# Patient Record
Sex: Male | Born: 1985 | Race: White | Hispanic: No | Marital: Single | Smoking: Current every day smoker
Health system: Southern US, Community
[De-identification: ages and names within clinical notes are randomized; demographics above are authoritative.]

## PROBLEM LIST (undated history)

## (undated) ENCOUNTER — Emergency Department (HOSPITAL_COMMUNITY): Admission: EM | Payer: BLUE CROSS/BLUE SHIELD | Source: Home / Self Care

## (undated) ENCOUNTER — Emergency Department: Admission: EM | Payer: BLUE CROSS/BLUE SHIELD

## (undated) DIAGNOSIS — T7840XA Allergy, unspecified, initial encounter: Secondary | ICD-10-CM

## (undated) DIAGNOSIS — M549 Dorsalgia, unspecified: Secondary | ICD-10-CM

## (undated) DIAGNOSIS — G8929 Other chronic pain: Secondary | ICD-10-CM

## (undated) DIAGNOSIS — Z8719 Personal history of other diseases of the digestive system: Secondary | ICD-10-CM

## (undated) DIAGNOSIS — Z720 Tobacco use: Secondary | ICD-10-CM

## (undated) DIAGNOSIS — J449 Chronic obstructive pulmonary disease, unspecified: Secondary | ICD-10-CM

## (undated) DIAGNOSIS — Z9889 Other specified postprocedural states: Secondary | ICD-10-CM

## (undated) HISTORY — PX: APPENDECTOMY: SHX54

## (undated) HISTORY — DX: Personal history of other diseases of the digestive system: Z87.19

## (undated) HISTORY — DX: Allergy, unspecified, initial encounter: T78.40XA

## (undated) HISTORY — PX: HERNIA REPAIR: SHX51

## (undated) HISTORY — DX: Other chronic pain: G89.29

## (undated) HISTORY — DX: Tobacco use: Z72.0

## (undated) HISTORY — DX: Dorsalgia, unspecified: M54.9

## (undated) HISTORY — DX: Other specified postprocedural states: Z98.890

---
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
## 2010-01-15 ENCOUNTER — Emergency Department: Payer: Self-pay | Admitting: Internal Medicine

## 2013-02-18 ENCOUNTER — Other Ambulatory Visit (HOSPITAL_COMMUNITY): Payer: Self-pay | Admitting: Family Medicine

## 2013-02-18 DIAGNOSIS — M545 Low back pain: Secondary | ICD-10-CM

## 2013-02-23 ENCOUNTER — Other Ambulatory Visit (HOSPITAL_COMMUNITY): Payer: Self-pay

## 2013-02-24 ENCOUNTER — Ambulatory Visit (HOSPITAL_COMMUNITY)
Admission: RE | Admit: 2013-02-24 | Discharge: 2013-02-24 | Disposition: A | Discharge status: Home / Self Care | Payer: Private Health Insurance - Indemnity | Source: Ambulatory Visit | Attending: Family Medicine | Admitting: Family Medicine

## 2013-02-24 ENCOUNTER — Other Ambulatory Visit (HOSPITAL_COMMUNITY): Payer: Self-pay | Admitting: Family Medicine

## 2013-02-24 DIAGNOSIS — M545 Low back pain, unspecified: Secondary | ICD-10-CM | POA: Insufficient documentation

## 2014-01-20 ENCOUNTER — Ambulatory Visit: Payer: Self-pay | Admitting: Pain Medicine

## 2014-01-20 LAB — BASIC METABOLIC PANEL
Anion Gap: 10 (ref 7–16)
BUN: 10 mg/dL (ref 7–18)
Calcium, Total: 9.3 mg/dL (ref 8.5–10.1)
Chloride: 108 mmol/L — ABNORMAL HIGH (ref 98–107)
Co2: 25 mmol/L (ref 21–32)
Creatinine: 0.79 mg/dL (ref 0.60–1.30)
EGFR (Non-African Amer.): >60
GLUCOSE: 94 mg/dL (ref 65–99)
Osmolality: 284 (ref 275–301)
POTASSIUM: 4.1 mmol/L (ref 3.5–5.1)
Sodium: 143 mmol/L (ref 136–145)

## 2014-01-20 LAB — HEPATIC FUNCTION PANEL A (ARMC)
ALBUMIN: 4.5 g/dL (ref 3.4–5.0)
ALT: 32 U/L
Alkaline Phosphatase: 76 U/L
Bilirubin, Direct: 0.1 mg/dL (ref 0.00–0.20)
Bilirubin,Total: 0.5 mg/dL (ref 0.2–1.0)
SGOT(AST): 20 U/L (ref 15–37)
Total Protein: 8 g/dL (ref 6.4–8.2)

## 2014-01-20 LAB — MAGNESIUM: Magnesium: 1.9 mg/dL

## 2014-01-20 LAB — SEDIMENTATION RATE: ERYTHROCYTE SED RATE: 1 mm/h (ref 0–15)

## 2014-01-25 ENCOUNTER — Ambulatory Visit: Payer: Self-pay | Admitting: Pain Medicine

## 2014-02-24 ENCOUNTER — Ambulatory Visit: Payer: Self-pay | Admitting: Pain Medicine

## 2014-06-04 ENCOUNTER — Ambulatory Visit: Payer: Self-pay | Admitting: Pain Medicine

## 2015-03-14 ENCOUNTER — Ambulatory Visit: Payer: BLUE CROSS/BLUE SHIELD | Attending: Pain Medicine | Admitting: Pain Medicine

## 2015-03-14 ENCOUNTER — Encounter: Payer: Self-pay | Admitting: Pain Medicine

## 2015-03-14 VITALS — BP 143/76 | HR 79 | Temp 98.0°F | Resp 16 | Ht 72.0 in | Wt 200.0 lb

## 2015-03-14 DIAGNOSIS — E559 Vitamin D deficiency, unspecified: Secondary | ICD-10-CM | POA: Insufficient documentation

## 2015-03-14 DIAGNOSIS — G8929 Other chronic pain: Secondary | ICD-10-CM | POA: Diagnosis not present

## 2015-03-14 DIAGNOSIS — M549 Dorsalgia, unspecified: Secondary | ICD-10-CM | POA: Diagnosis present

## 2015-03-14 DIAGNOSIS — F1190 Opioid use, unspecified, uncomplicated: Secondary | ICD-10-CM | POA: Insufficient documentation

## 2015-03-14 DIAGNOSIS — M545 Low back pain, unspecified: Secondary | ICD-10-CM

## 2015-03-14 DIAGNOSIS — M47896 Other spondylosis, lumbar region: Secondary | ICD-10-CM | POA: Diagnosis not present

## 2015-03-14 DIAGNOSIS — Z79891 Long term (current) use of opiate analgesic: Secondary | ICD-10-CM | POA: Insufficient documentation

## 2015-03-14 DIAGNOSIS — M5382 Other specified dorsopathies, cervical region: Secondary | ICD-10-CM

## 2015-03-14 DIAGNOSIS — Z5181 Encounter for therapeutic drug level monitoring: Secondary | ICD-10-CM | POA: Insufficient documentation

## 2015-03-14 DIAGNOSIS — M47812 Spondylosis without myelopathy or radiculopathy, cervical region: Secondary | ICD-10-CM | POA: Insufficient documentation

## 2015-03-14 DIAGNOSIS — F1120 Opioid dependence, uncomplicated: Secondary | ICD-10-CM | POA: Insufficient documentation

## 2015-03-14 DIAGNOSIS — M47816 Spondylosis without myelopathy or radiculopathy, lumbar region: Secondary | ICD-10-CM | POA: Insufficient documentation

## 2015-03-14 MED ORDER — OXYCODONE HCL 5 MG PO TABS: 5.0000 mg | ORAL_TABLET | Freq: Four times a day (QID) | ORAL | Status: DC | PRN

## 2015-03-14 MED ORDER — OXYCODONE HCL 10 MG PO TABS: 10.0000 mg | ORAL_TABLET | Freq: Four times a day (QID) | ORAL | Status: DC | PRN

## 2015-03-14 NOTE — Progress Notes (Signed)
Patient's Name: Ricardo Knox MRN: [id number] DOB: 06-18-1985 DOS: 03/14/2015  Primary Reason(s) for Visit: Encounter for Medication Management. CC: Back Pain   HPI:   Ricardo Knox is a 29 y.o. year old, male patient, who returns today as an established patient. He has Lumbar spondylosis; Encounter for therapeutic drug level monitoring; Long term current use of opiate analgesic; Uncomplicated opioid dependence (HCC); Opiate use; Facet syndrome, lumbar; Chronic low back pain; and Chronic pain on his problem list.. His primarily concern today is the Back Pain   The patient comes into clinic today indicating that he would like to start tapering his medication down. He does not want to continue taking this Medication for the rest of his life. He indicated that the left lumbar facet block that we did months ago, did provide him with excellent relief the pain, which he still enjoys. To help him taper the medication down we will divide his dose from a 15 milligram pill to one 10 mg pill plus a 5 mg pill. We will then slowly discontinue the 5 mg pills until he is down to the 10 mg pills. We should be able to accomplish this in approximately one month. Once again we get to this point, we will do the same pain with the 10 mg pills splitting them in 5's.  Pharmacotherapy Review: Side-effects or Adverse reactions: None reported. Effectiveness: Described as relatively effective, allowing for increase in activities of daily living (ADL). Onset of action: Within expected pharmacological parameters. Duration of action: Within normal limits for medication. Peak effect: Timing and results are as within normal expected parameters. [place] PMP: Compliant with practice rules and regulations. DST: Compliant with practice rules and regulations. Lab work: No new labs ordered by our practice. Treatment compliance: Compliant. Substance Use Disorder (SUD) Risk Level: Low Planned course of action: Continue therapy as  is.  Allergies: Ricardo Knox is allergic to hydrocodone.  Meds: The patient has a current medication list which includes the following prescription(s): cyclobenzaprine, metaxalone, oxycodone, oxycodone, and oxycodone hcl. Requested Prescriptions   Signed Prescriptions Disp Refills  . Oxycodone HCl 10 MG TABS 120 tablet 0    Sig: Take 1 tablet (10 mg total) by mouth every 6 (six) hours as needed.  Marland Kitchen oxyCODONE (OXY IR/ROXICODONE) 5 MG immediate release tablet 70 tablet 0    Sig: Take 1 tablet (5 mg total) by mouth every 6 (six) hours as needed for severe pain (Take this medication 4 times a day on the first week, 3 times a day on the second week, twice a day on the third week, daily on the fourth week, and stop on the fifth week.).    ROS: Constitutional: Afebrile, no chills, well hydrated and well nourished Gastrointestinal: negative Musculoskeletal:negative Neurological: negative Behavioral/Psych: negative  PFSH: Medical:  Ricardo Knox  has a past medical history of Allergy; Tobacco use; History of herniorrhaphy; and Chronic back pain greater than 3 months duration. Family: family history includes High Cholesterol in his father; Kidney cancer in his father. Surgical:  has past surgical history that includes Appendectomy and Hernia repair. Tobacco:  reports that he has been smoking Cigarettes.  He has a 15 pack-year smoking history. He does not have any smokeless tobacco history on file. Alcohol:  reports that he does not drink alcohol. Drug:  reports that he does not use illicit drugs.  Physical Exam: Vitals:  Today's Vitals   03/14/15 0918 03/14/15 0920  BP: 143/76   Pulse: 79   Temp: 98 F (  36.7 C)   TempSrc: Oral   Resp: 16   Height: 6' (1.829 m)   Weight: 200 lb (90.719 kg)   SpO2: 99%   PainSc:  4   Calculated BMI: Body mass index is 27.12 kg/(m^2). General appearance: alert, cooperative, appears stated age and no distress Eyes: conjunctivae/corneas clear. PERRL,  EOM's intact. Fundi benign. Lungs: No evidence respiratory distress, no audible rales or ronchi and no use of accessory muscles of respiration Neck: no adenopathy, no carotid bruit, no JVD, supple, symmetrical, trachea midline and thyroid not enlarged, symmetric, no tenderness/mass/nodules Back: symmetric, no curvature. ROM normal. No CVA tenderness. Extremities: extremities normal, atraumatic, no cyanosis or edema Pulses: 2+ and symmetric Skin: Skin color, texture, turgor normal. No rashes or lesions Neurologic: Grossly normal    Assessment: Encounter Diagnosis:  Primary Diagnosis: Chronic pain [G89.29]  Plan: Ricardo Knox was seen today for back pain.  Diagnoses and all orders for this visit:  Chronic pain  Chronic low back pain  Facet syndrome, lumbar Comments: Left-sided Orders: -     LUMBAR FACET(MEDIAL BRANCH NERVE BLOCK) MBNB; Future  Opiate use  Uncomplicated opioid dependence (HCC)  Long term current use of opiate analgesic -     Drugs of abuse screen w/o alc, rtn urine-sln; Standing  Encounter for therapeutic drug level monitoring  Lumbar spondylosis  Other orders -     Oxycodone HCl 10 MG TABS; Take 1 tablet (10 mg total) by mouth every 6 (six) hours as needed. -     oxyCODONE (OXY IR/ROXICODONE) 5 MG immediate release tablet; Take 1 tablet (5 mg total) by mouth every 6 (six) hours as needed for severe pain (Take this medication 4 times a day on the first week, 3 times a day on the second week, twice a day on the third week, daily on the fourth week, and stop on the fifth week.).     There are no Patient Instructions on file for this visit. Medications discontinued today:  There are no discontinued medications. Medications administered today:  Ricardo Knox does not currently have medications on file.  Primary Care Physician: Evelene CroonNIEMEYER, MEINDERT, MD [facility] Note by: Sydnee LevansFrancisco A. Laban EmperorNaveira, M.D, DABA, DABAPM, DABPM,  DABIPP, FIPP

## 2015-03-14 NOTE — Progress Notes (Signed)
Safety precautions to be maintained throughout the outpatient stay will include: orient to surroundings, keep bed in low position, maintain call bell within reach at all times, provide assistance with transfer out of bed and ambulation.  

## 2015-03-14 NOTE — Progress Notes (Deleted)
   Subjective:    Patient ID: Ricardo Knox, male    DOB: 01-14-1986, 29 y.o.   MRN: [id number]  HPI    Review of Systems     Objective:   Physical Exam        Assessment & Plan:

## 2015-04-04 ENCOUNTER — Other Ambulatory Visit: Payer: Self-pay | Admitting: Pain Medicine

## 2015-04-13 ENCOUNTER — Encounter: Payer: Self-pay | Admitting: Pain Medicine

## 2015-04-13 ENCOUNTER — Ambulatory Visit: Payer: BLUE CROSS/BLUE SHIELD | Attending: Pain Medicine | Admitting: Pain Medicine

## 2015-04-13 VITALS — BP 141/86 | HR 70 | Temp 98.0°F | Resp 16 | Ht 72.0 in | Wt 200.0 lb

## 2015-04-13 DIAGNOSIS — F17210 Nicotine dependence, cigarettes, uncomplicated: Secondary | ICD-10-CM | POA: Insufficient documentation

## 2015-04-13 DIAGNOSIS — E559 Vitamin D deficiency, unspecified: Secondary | ICD-10-CM | POA: Insufficient documentation

## 2015-04-13 DIAGNOSIS — M47812 Spondylosis without myelopathy or radiculopathy, cervical region: Secondary | ICD-10-CM | POA: Insufficient documentation

## 2015-04-13 DIAGNOSIS — M47816 Spondylosis without myelopathy or radiculopathy, lumbar region: Secondary | ICD-10-CM | POA: Diagnosis not present

## 2015-04-13 DIAGNOSIS — G8929 Other chronic pain: Secondary | ICD-10-CM | POA: Insufficient documentation

## 2015-04-13 DIAGNOSIS — F1190 Opioid use, unspecified, uncomplicated: Secondary | ICD-10-CM | POA: Insufficient documentation

## 2015-04-13 DIAGNOSIS — M79605 Pain in left leg: Secondary | ICD-10-CM | POA: Insufficient documentation

## 2015-04-13 DIAGNOSIS — M25562 Pain in left knee: Secondary | ICD-10-CM | POA: Diagnosis not present

## 2015-04-13 DIAGNOSIS — Z79891 Long term (current) use of opiate analgesic: Secondary | ICD-10-CM

## 2015-04-13 DIAGNOSIS — M792 Neuralgia and neuritis, unspecified: Secondary | ICD-10-CM | POA: Insufficient documentation

## 2015-04-13 DIAGNOSIS — M791 Myalgia: Secondary | ICD-10-CM

## 2015-04-13 DIAGNOSIS — Z5181 Encounter for therapeutic drug level monitoring: Secondary | ICD-10-CM

## 2015-04-13 DIAGNOSIS — M5382 Other specified dorsopathies, cervical region: Secondary | ICD-10-CM | POA: Diagnosis not present

## 2015-04-13 DIAGNOSIS — M7918 Myalgia, other site: Secondary | ICD-10-CM

## 2015-04-13 DIAGNOSIS — M549 Dorsalgia, unspecified: Secondary | ICD-10-CM | POA: Diagnosis present

## 2015-04-13 MED ORDER — GABAPENTIN 300 MG PO CAPS: ORAL_CAPSULE | ORAL | Status: DC

## 2015-04-13 MED ORDER — OXYCODONE HCL 5 MG PO TABS: 5.0000 mg | ORAL_TABLET | Freq: Four times a day (QID) | ORAL | Status: DC | PRN

## 2015-04-13 MED ORDER — OXYCODONE HCL 10 MG PO TABS: 10.0000 mg | ORAL_TABLET | Freq: Four times a day (QID) | ORAL | Status: DC | PRN

## 2015-04-13 NOTE — Progress Notes (Signed)
Patient's Name: Ricardo Knox MRN: [id number] DOB: 03/12/1986 DOS: 04/13/2015  Primary Reason(s) for Visit: Encounter for Medication Management. CC: Back Pain   HPI:   Ricardo Knox is a 29 y.o. year old, male patient, who returns today as an established patient. He has Lumbar spondylosis; Encounter for therapeutic drug level monitoring; Long term current use of opiate analgesic; Uncomplicated opioid dependence ([place]); Opiate use; Lumbar facet syndrome; Chronic low back pain; Chronic pain; Cervical facet syndrome; Vitamin D deficiency; Cervical spondylosis; Neuropathic pain; Neurogenic pain; Chronic left lower extremity pain; Chronic left knee pain; and Musculoskeletal pain on his problem list.. His primarily concern today is the Back Pain     The patient comes into the clinic's today for follow-up evaluation on his medications. He seems to be doing well. No changes.  Today's Pain Score: 6 . Clinically he looks like a 2-3/10. Reported level of pain is incompatible with clinical obrservations. This may be secondary to a possible lack of understanding on how the pain scale works. Pain Type: Intractable pain Pain Location: Back Pain Orientation: Lower Pain Descriptors / Indicators: Aching, Sharp, Throbbing Pain Frequency: Constant  Date of Last Visit: 03/14/15 Service Provided on Last Visit: Med Refill  Pharmacotherapy Review:   Side-effects or Adverse reactions: None reported. Effectiveness: Described as relatively effective, allowing for increase in activities of daily living (ADL). Onset of action: Within expected pharmacological parameters. Duration of action: Within normal limits for medication. Peak effect: Timing and results are as within normal expected parameters. [place] PMP: Compliant with practice rules and regulations. UDS: Compliant with practice rules and regulations. Treatment compliance: Compliant. Substance Use Disorder (SUD) Risk Level: Low Planned course of action:  Continue therapy as is.  Last Available Lab Work: [facility] Conversion on 01/20/2014  Component Date Value Ref Range Status  . Magnesium 01/20/2014 1.9   Final  . Glucose 01/20/2014 94  65-99 mg/dL Final  . BUN 01/20/2014 10  7-18 mg/dL Final  . Creatinine 01/20/2014 0.79  0.60-1.30 mg/dL Final  . Sodium 01/20/2014 143  136-145 mmol/L Final  . Potassium 01/20/2014 4.1  3.5-5.1 mmol/L Final  . Chloride 01/20/2014 108* 98-107 mmol/L Final  . Co2 01/20/2014 25  21-32 mmol/L Final  . Calcium, Total 01/20/2014 9.3  8.5-10.1 mg/dL Final  . Osmolality 01/20/2014 284  275-301 Final  . Anion Gap 01/20/2014 10  7-16 Final  . EGFR (African American) 01/20/2014 >60   Final  . EGFR (Non-African Amer.) 01/20/2014 >60   Final  . SGOT(AST) 01/20/2014 20  15-37 Unit/L Final  . SGPT (ALT) 01/20/2014 32   Final  . Alkaline Phosphatase 01/20/2014 76   Final  . Albumin 01/20/2014 4.5  3.4-5.0 g/dL Final  . Total Protein 01/20/2014 8.0  6.4-8.2 g/dL Final  . Bilirubin,Total 01/20/2014 0.5  0.2-1.0 mg/dL Final  . Bilirubin, Direct 01/20/2014 0.1  0.00-0.20 mg/dL Final  . Erythrocyte Sed Rate 01/20/2014 1  0-15 mm/hr Final   Allergies: Ricardo Knox is allergic to hydrocodone.  Meds: The patient has a current medication list which includes the following prescription(s): cyclobenzaprine, metaxalone, oxycodone, oxycodone hcl, and gabapentin. Requested Prescriptions   Signed Prescriptions Disp Refills  . oxyCODONE (OXY IR/ROXICODONE) 5 MG immediate release tablet 120 tablet 0    Sig: Take 1 tablet (5 mg total) by mouth every 6 (six) hours as needed for severe pain (Take this medication 4 times a day on the first week, 3 times a day on the second week, twice a day on the third week,  daily on the fourth week, and stop on the fifth week.).  Marland Kitchen Oxycodone HCl 10 MG TABS 120 tablet 0    Sig: Take 1 tablet (10 mg total) by mouth every 6 (six) hours as needed.  . gabapentin (NEURONTIN) 300 MG capsule 90 capsule 0     Sig: Take 1 tablet at bedtime for 1 week, 2 tablets at bedtime on the following week, and then 3 tablets at bedtime on week 3.    ROS: Constitutional: Afebrile, no chills, well hydrated and well nourished Gastrointestinal: negative Musculoskeletal:negative Neurological: negative Behavioral/Psych: negative  PFSH: Medical:  Ricardo Knox  has a past medical history of Allergy; Tobacco use; History of herniorrhaphy; and Chronic back pain greater than 3 months duration. Family: family history includes High Cholesterol in his father; Kidney cancer in his father. Surgical:  has past surgical history that includes Appendectomy and Hernia repair. Tobacco:  reports that he has been smoking Cigarettes.  He has a 15 pack-year smoking history. He does not have any smokeless tobacco history on file. Alcohol:  reports that he does not drink alcohol. Drug:  reports that he does not use illicit drugs.  Physical Exam: Vitals:  Today's Vitals   04/13/15 0947 04/13/15 0949  BP: 141/86   Pulse: 70   Temp: 98 F (36.7 C)   Resp: 16   Height: 6' (1.829 m)   Weight: 200 lb (90.719 kg)   SpO2: 100%   PainSc: 6  6   PainLoc: Back   Calculated BMI: Body mass index is 27.12 kg/(m^2). General appearance: alert, cooperative, appears stated age and no distress Eyes: conjunctivae/corneas clear. PERRL, EOM's intact. Fundi benign. Lungs: No evidence respiratory distress, no audible rales or ronchi and no use of accessory muscles of respiration Neck: no adenopathy, no carotid bruit, no JVD, supple, symmetrical, trachea midline and thyroid not enlarged, symmetric, no tenderness/mass/nodules Back: symmetric, no curvature. ROM normal. No CVA tenderness. Extremities: extremities normal, atraumatic, no cyanosis or edema Pulses: 2+ and symmetric Skin: Skin color, texture, turgor normal. No rashes or lesions Neurologic: Grossly normal    Assessment: Encounter Diagnosis:  Primary Diagnosis: Cervical spondylosis  without myelopathy [M47.812]  Plan: Ricardo Knox was seen today for back pain.  Diagnoses and all orders for this visit:  Cervical spondylosis  Cervical facet syndrome  Encounter for therapeutic drug level monitoring  Long term current use of opiate analgesic -     Drugs of abuse screen w/o alc, rtn urine-sln -     Drugs of abuse screen w/o alc, rtn urine-sln; Standing  Opiate use  Chronic pain -     oxyCODONE (OXY IR/ROXICODONE) 5 MG immediate release tablet; Take 1 tablet (5 mg total) by mouth every 6 (six) hours as needed for severe pain (Take this medication 4 times a day on the first week, 3 times a day on the second week, twice a day on the third week, daily on the fourth week, and stop on the fifth week.). -     Oxycodone HCl 10 MG TABS; Take 1 tablet (10 mg total) by mouth every 6 (six) hours as needed.  Neuropathic pain -     gabapentin (NEURONTIN) 300 MG capsule; Take 1 tablet at bedtime for 1 week, 2 tablets at bedtime on the following week, and then 3 tablets at bedtime on week 3.  Neurogenic pain  Lumbar spondylosis  Chronic left lower extremity pain  Chronic left knee pain  Musculoskeletal pain     Patient Instructions  Smoking  Cessation, Tips for Success If you are ready to quit smoking, congratulations! You have chosen to help yourself be healthier. Cigarettes bring nicotine, tar, carbon monoxide, and other irritants into your body. Your lungs, heart, and blood vessels will be able to work better without these poisons. There are many different ways to quit smoking. Nicotine gum, nicotine patches, a nicotine inhaler, or nicotine nasal spray can help with physical craving. Hypnosis, support groups, and medicines help break the habit of smoking. WHAT THINGS CAN I DO TO MAKE QUITTING EASIER?  Here are some tips to help you quit for good:  Pick a date when you will quit smoking completely. Tell all of your friends and family about your plan to quit on that  date.  Do not try to slowly cut down on the number of cigarettes you are smoking. Pick a quit date and quit smoking completely starting on that day.  Throw away all cigarettes.   Clean and remove all ashtrays from your home, work, and car.  On a card, write down your reasons for quitting. Carry the card with you and read it when you get the urge to smoke.  Cleanse your body of nicotine. Drink enough water and fluids to keep your urine clear or pale yellow. Do this after quitting to flush the nicotine from your body.  Learn to predict your moods. Do not let a bad situation be your excuse to have a cigarette. Some situations in your life might tempt you into wanting a cigarette.  Never have "just one" cigarette. It leads to wanting another and another. Remind yourself of your decision to quit.  Change habits associated with smoking. If you smoked while driving or when feeling stressed, try other activities to replace smoking. Stand up when drinking your coffee. Brush your teeth after eating. Sit in a different chair when you read the paper. Avoid alcohol while trying to quit, and try to drink fewer caffeinated beverages. Alcohol and caffeine may urge you to smoke.  Avoid foods and drinks that can trigger a desire to smoke, such as sugary or spicy foods and alcohol.  Ask people who smoke not to smoke around you.  Have something planned to do right after eating or having a cup of coffee. For example, plan to take a walk or exercise.  Try a relaxation exercise to calm you down and decrease your stress. Remember, you may be tense and nervous for the first 2 weeks after you quit, but this will pass.  Find new activities to keep your hands busy. Play with a pen, coin, or rubber band. Doodle or draw things on paper.  Brush your teeth right after eating. This will help cut down on the craving for the taste of tobacco after meals. You can also try mouthwash.   Use oral substitutes in place of  cigarettes. Try using lemon drops, carrots, cinnamon sticks, or chewing gum. Keep them handy so they are available when you have the urge to smoke.  When you have the urge to smoke, try deep breathing.  Designate your home as a nonsmoking area.  If you are a heavy smoker, ask your health care provider about a prescription for nicotine chewing gum. It can ease your withdrawal from nicotine.  Reward yourself. Set aside the cigarette money you save and buy yourself something nice.  Look for support from others. Join a support group or smoking cessation program. Ask someone at home or at work to help you with your plan  to quit smoking.  Always ask yourself, "Do I need this cigarette or is this just a reflex?" Tell yourself, "Today, I choose not to smoke," or "I do not want to smoke." You are reminding yourself of your decision to quit.  Do not replace cigarette smoking with electronic cigarettes (commonly called e-cigarettes). The safety of e-cigarettes is unknown, and some may contain harmful chemicals.  If you relapse, do not give up! Plan ahead and think about what you will do the next time you get the urge to smoke. HOW WILL I FEEL WHEN I QUIT SMOKING? You may have symptoms of withdrawal because your body is used to nicotine (the addictive substance in cigarettes). You may crave cigarettes, be irritable, feel very hungry, cough often, get headaches, or have difficulty concentrating. The withdrawal symptoms are only temporary. They are strongest when you first quit but will go away within 10-14 days. When withdrawal symptoms occur, stay in control. Think about your reasons for quitting. Remind yourself that these are signs that your body is healing and getting used to being without cigarettes. Remember that withdrawal symptoms are easier to treat than the major diseases that smoking can cause.  Even after the withdrawal is over, expect periodic urges to smoke. However, these cravings are generally  short lived and will go away whether you smoke or not. Do not smoke! WHAT RESOURCES ARE AVAILABLE TO HELP ME QUIT SMOKING? Your health care provider can direct you to community resources or hospitals for support, which may include:  Group support.  Education.  Hypnosis.  Therapy.   This information is not intended to replace advice given to you by your health care provider. Make sure you discuss any questions you have with your health care provider.   Document Released: 02/10/2004 Document Revised: 06/04/2014 Document Reviewed: 10/30/2012 Elsevier Interactive Patient Education 2016 Elsevier Inc. Gabapentin capsules or tablets What is this medicine? GABAPENTIN (GA ba pen tin) is used to control partial seizures in adults with epilepsy. It is also used to treat certain types of nerve pain. This medicine may be used for other purposes; ask your health care provider or pharmacist if you have questions. What should I tell my health care provider before I take this medicine? They need to know if you have any of these conditions: -kidney disease -suicidal thoughts, plans, or attempt; a previous suicide attempt by you or a family member -an unusual or allergic reaction to gabapentin, other medicines, foods, dyes, or preservatives -pregnant or trying to get pregnant -breast-feeding How should I use this medicine? Take this medicine by mouth with a glass of water. Follow the directions on the prescription label. You can take it with or without food. If it upsets your stomach, take it with food.Take your medicine at regular intervals. Do not take it more often than directed. Do not stop taking except on your doctor's advice. If you are directed to break the 600 or 800 mg tablets in half as part of your dose, the extra half tablet should be used for the next dose. If you have not used the extra half tablet within 28 days, it should be thrown away. A special MedGuide will be given to you by the  pharmacist with each prescription and refill. Be sure to read this information carefully each time. Talk to your pediatrician regarding the use of this medicine in children. Special care may be needed. Overdosage: If you think you have taken too much of this medicine contact a poison control  center or emergency room at once. NOTE: This medicine is only for you. Do not share this medicine with others. What if I miss a dose? If you miss a dose, take it as soon as you can. If it is almost time for your next dose, take only that dose. Do not take double or extra doses. What may interact with this medicine? Do not take this medicine with any of the following medications: -other gabapentin products This medicine may also interact with the following medications: -alcohol -antacids -antihistamines for allergy, cough and cold -certain medicines for anxiety or sleep -certain medicines for depression or psychotic disturbances -homatropine; hydrocodone -naproxen -narcotic medicines (opiates) for pain -phenothiazines like chlorpromazine, mesoridazine, prochlorperazine, thioridazine This list may not describe all possible interactions. Give your health care provider a list of all the medicines, herbs, non-prescription drugs, or dietary supplements you use. Also tell them if you smoke, drink alcohol, or use illegal drugs. Some items may interact with your medicine. What should I watch for while using this medicine? Visit your doctor or health care professional for regular checks on your progress. You may want to keep a record at home of how you feel your condition is responding to treatment. You may want to share this information with your doctor or health care professional at each visit. You should contact your doctor or health care professional if your seizures get worse or if you have any new types of seizures. Do not stop taking this medicine or any of your seizure medicines unless instructed by your  doctor or health care professional. Stopping your medicine suddenly can increase your seizures or their severity. Wear a medical identification bracelet or chain if you are taking this medicine for seizures, and carry a card that lists all your medications. You may get drowsy, dizzy, or have blurred vision. Do not drive, use machinery, or do anything that needs mental alertness until you know how this medicine affects you. To reduce dizzy or fainting spells, do not sit or stand up quickly, especially if you are an older patient. Alcohol can increase drowsiness and dizziness. Avoid alcoholic drinks. Your mouth may get dry. Chewing sugarless gum or sucking hard candy, and drinking plenty of water will help. The use of this medicine may increase the chance of suicidal thoughts or actions. Pay special attention to how you are responding while on this medicine. Any worsening of mood, or thoughts of suicide or dying should be reported to your health care professional right away. Women who become pregnant while using this medicine may enroll in the [place] Pregnancy Registry by calling [phone]. This registry collects information about the safety of antiepileptic drug use during pregnancy. What side effects may I notice from receiving this medicine? Side effects that you should report to your doctor or health care professional as soon as possible: -allergic reactions like skin rash, itching or hives, swelling of the face, lips, or tongue -worsening of mood, thoughts or actions of suicide or dying Side effects that usually do not require medical attention (report to your doctor or health care professional if they continue or are bothersome): -constipation -difficulty walking or controlling muscle movements -dizziness -nausea -slurred speech -tiredness -tremors -weight gain This list may not describe all possible side effects. Call your doctor for medical advice about side  effects. You may report side effects to FDA at 1-800-FDA-1088. Where should I keep my medicine? Keep out of reach of children. This medicine may cause accidental overdose and  death if it taken by other adults, children, or pets. Mix any unused medicine with a substance like cat litter or coffee grounds. Then throw the medicine away in a sealed container like a sealed bag or a coffee can with a lid. Do not use the medicine after the expiration date. Store at room temperature between 15 and 30 degrees C (59 and 86 degrees F). NOTE: This sheet is a summary. It may not cover all possible information. If you have questions about this medicine, talk to your doctor, pharmacist, or health care provider.    2016, Elsevier/Gold Standard. (2013-07-10 15:26:50)    Medications discontinued today:  Medications Discontinued During This Encounter  Medication Reason  . oxyCODONE (ROXICODONE) 15 MG immediate release tablet Error  . oxyCODONE (OXY IR/ROXICODONE) 5 MG immediate release tablet Reorder  . Oxycodone HCl 10 MG TABS Reorder   Medications administered today:  Ricardo Knox had no medications administered during this visit.  Primary Care Physician: Lorelee Market, MD Location: [facility] Note by: Kathlen Brunswick Dossie Arbour, M.D, DABA, DABAPM, DABPM, DABIPP, FIPP

## 2015-04-13 NOTE — Patient Instructions (Addendum)
Smoking Cessation, Tips for Success If you are ready to quit smoking, congratulations! You have chosen to help yourself be healthier. Cigarettes bring nicotine, tar, carbon monoxide, and other irritants into your body. Your lungs, heart, and blood vessels will be able to work better without these poisons. There are many different ways to quit smoking. Nicotine gum, nicotine patches, a nicotine inhaler, or nicotine nasal spray can help with physical craving. Hypnosis, support groups, and medicines help break the habit of smoking. WHAT THINGS CAN I DO TO MAKE QUITTING EASIER?  Here are some tips to help you quit for good:  Pick a date when you will quit smoking completely. Tell all of your friends and family about your plan to quit on that date.  Do not try to slowly cut down on the number of cigarettes you are smoking. Pick a quit date and quit smoking completely starting on that day.  Throw away all cigarettes.   Clean and remove all ashtrays from your home, work, and car.  On a card, write down your reasons for quitting. Carry the card with you and read it when you get the urge to smoke.  Cleanse your body of nicotine. Drink enough water and fluids to keep your urine clear or pale yellow. Do this after quitting to flush the nicotine from your body.  Learn to predict your moods. Do not let a bad situation be your excuse to have a cigarette. Some situations in your life might tempt you into wanting a cigarette.  Never have "just one" cigarette. It leads to wanting another and another. Remind yourself of your decision to quit.  Change habits associated with smoking. If you smoked while driving or when feeling stressed, try other activities to replace smoking. Stand up when drinking your coffee. Brush your teeth after eating. Sit in a different chair when you read the paper. Avoid alcohol while trying to quit, and try to drink fewer caffeinated beverages. Alcohol and caffeine may urge you to  smoke.  Avoid foods and drinks that can trigger a desire to smoke, such as sugary or spicy foods and alcohol.  Ask people who smoke not to smoke around you.  Have something planned to do right after eating or having a cup of coffee. For example, plan to take a walk or exercise.  Try a relaxation exercise to calm you down and decrease your stress. Remember, you may be tense and nervous for the first 2 weeks after you quit, but this will pass.  Find new activities to keep your hands busy. Play with a pen, coin, or rubber band. Doodle or draw things on paper.  Brush your teeth right after eating. This will help cut down on the craving for the taste of tobacco after meals. You can also try mouthwash.   Use oral substitutes in place of cigarettes. Try using lemon drops, carrots, cinnamon sticks, or chewing gum. Keep them handy so they are available when you have the urge to smoke.  When you have the urge to smoke, try deep breathing.  Designate your home as a nonsmoking area.  If you are a heavy smoker, ask your health care provider about a prescription for nicotine chewing gum. It can ease your withdrawal from nicotine.  Reward yourself. Set aside the cigarette money you save and buy yourself something nice.  Look for support from others. Join a support group or smoking cessation program. Ask someone at home or at work to help you with your plan   to quit smoking.  Always ask yourself, "Do I need this cigarette or is this just a reflex?" Tell yourself, "Today, I choose not to smoke," or "I do not want to smoke." You are reminding yourself of your decision to quit.  Do not replace cigarette smoking with electronic cigarettes (commonly called e-cigarettes). The safety of e-cigarettes is unknown, and some may contain harmful chemicals.  If you relapse, do not give up! Plan ahead and think about what you will do the next time you get the urge to smoke. HOW WILL I FEEL WHEN I QUIT SMOKING? You  may have symptoms of withdrawal because your body is used to nicotine (the addictive substance in cigarettes). You may crave cigarettes, be irritable, feel very hungry, cough often, get headaches, or have difficulty concentrating. The withdrawal symptoms are only temporary. They are strongest when you first quit but will go away within 10-14 days. When withdrawal symptoms occur, stay in control. Think about your reasons for quitting. Remind yourself that these are signs that your body is healing and getting used to being without cigarettes. Remember that withdrawal symptoms are easier to treat than the major diseases that smoking can cause.  Even after the withdrawal is over, expect periodic urges to smoke. However, these cravings are generally short lived and will go away whether you smoke or not. Do not smoke! WHAT RESOURCES ARE AVAILABLE TO HELP ME QUIT SMOKING? Your health care provider can direct you to community resources or hospitals for support, which may include:  Group support.  Education.  Hypnosis.  Therapy.   This information is not intended to replace advice given to you by your health care provider. Make sure you discuss any questions you have with your health care provider.   Document Released: 02/10/2004 Document Revised: 06/04/2014 Document Reviewed: 10/30/2012 Elsevier Interactive Patient Education 2016 Elsevier [employer] Gabapentin capsules or tablets What is this medicine? GABAPENTIN (GA ba pen tin) is used to control partial seizures in adults with epilepsy. It is also used to treat certain types of nerve pain. This medicine may be used for other purposes; ask your health care provider or pharmacist if you have questions. What should I tell my health care provider before I take this medicine? They need to know if you have any of these conditions: -kidney disease -suicidal thoughts, plans, or attempt; a previous suicide attempt by you or a family member -an unusual or  allergic reaction to gabapentin, other medicines, foods, dyes, or preservatives -pregnant or trying to get pregnant -breast-feeding How should I use this medicine? Take this medicine by mouth with a glass of water. Follow the directions on the prescription label. You can take it with or without food. If it upsets your stomach, take it with food.Take your medicine at regular intervals. Do not take it more often than directed. Do not stop taking except on your doctor's advice. If you are directed to break the 600 or 800 mg tablets in half as part of your dose, the extra half tablet should be used for the next dose. If you have not used the extra half tablet within 28 days, it should be thrown away. A special MedGuide will be given to you by the pharmacist with each prescription and refill. Be sure to read this information carefully each time. Talk to your pediatrician regarding the use of this medicine in children. Special care may be needed. Overdosage: If you think you have taken too much of this medicine contact a poison control  center or emergency room at once. NOTE: This medicine is only for you. Do not share this medicine with others. What if I miss a dose? If you miss a dose, take it as soon as you can. If it is almost time for your next dose, take only that dose. Do not take double or extra doses. What may interact with this medicine? Do not take this medicine with any of the following medications: -other gabapentin products This medicine may also interact with the following medications: -alcohol -antacids -antihistamines for allergy, cough and cold -certain medicines for anxiety or sleep -certain medicines for depression or psychotic disturbances -homatropine; hydrocodone -naproxen -narcotic medicines (opiates) for pain -phenothiazines like chlorpromazine, mesoridazine, prochlorperazine, thioridazine This list may not describe all possible interactions. Give your health care provider  a list of all the medicines, herbs, non-prescription drugs, or dietary supplements you use. Also tell them if you smoke, drink alcohol, or use illegal drugs. Some items may interact with your medicine. What should I watch for while using this medicine? Visit your doctor or health care professional for regular checks on your progress. You may want to keep a record at home of how you feel your condition is responding to treatment. You may want to share this information with your doctor or health care professional at each visit. You should contact your doctor or health care professional if your seizures get worse or if you have any new types of seizures. Do not stop taking this medicine or any of your seizure medicines unless instructed by your doctor or health care professional. Stopping your medicine suddenly can increase your seizures or their severity. Wear a medical identification bracelet or chain if you are taking this medicine for seizures, and carry a card that lists all your medications. You may get drowsy, dizzy, or have blurred vision. Do not drive, use machinery, or do anything that needs mental alertness until you know how this medicine affects you. To reduce dizzy or fainting spells, do not sit or stand up quickly, especially if you are an older patient. Alcohol can increase drowsiness and dizziness. Avoid alcoholic drinks. Your mouth may get dry. Chewing sugarless gum or sucking hard candy, and drinking plenty of water will help. The use of this medicine may increase the chance of suicidal thoughts or actions. Pay special attention to how you are responding while on this medicine. Any worsening of mood, or thoughts of suicide or dying should be reported to your health care professional right away. Women who become pregnant while using this medicine may enroll in the [place] American Antiepileptic Drug Pregnancy Registry by calling [phone]. This registry collects information about the safety  of antiepileptic drug use during pregnancy. What side effects may I notice from receiving this medicine? Side effects that you should report to your doctor or health care professional as soon as possible: -allergic reactions like skin rash, itching or hives, swelling of the face, lips, or tongue -worsening of mood, thoughts or actions of suicide or dying Side effects that usually do not require medical attention (report to your doctor or health care professional if they continue or are bothersome): -constipation -difficulty walking or controlling muscle movements -dizziness -nausea -slurred speech -tiredness -tremors -weight gain This list may not describe all possible side effects. Call your doctor for medical advice about side effects. You may report side effects to FDA at 1-800-FDA-1088. Where should I keep my medicine? Keep out of reach of children. This medicine may cause accidental overdose and  death if it taken by other adults, children, or pets. Mix any unused medicine with a substance like cat litter or coffee grounds. Then throw the medicine away in a sealed container like a sealed bag or a coffee can with a lid. Do not use the medicine after the expiration date. Store at room temperature between 15 and 30 degrees C (59 and 86 degrees F). NOTE: This sheet is a summary. It may not cover all possible information. If you have questions about this medicine, talk to your doctor, pharmacist, or health care provider.    2016, Elsevier/Gold Standard. (2013-07-10 15:26:50)

## 2015-04-13 NOTE — Progress Notes (Signed)
Safety precautions to be maintained throughout the outpatient stay will include: orient to surroundings, keep bed in low position, maintain call bell within reach at all times, provide assistance with transfer out of bed and ambulation. Pill count #0 

## 2015-05-12 ENCOUNTER — Encounter: Payer: Self-pay | Admitting: Pain Medicine

## 2015-05-12 ENCOUNTER — Ambulatory Visit: Payer: BLUE CROSS/BLUE SHIELD | Attending: Pain Medicine | Admitting: Pain Medicine

## 2015-05-12 VITALS — BP 114/86 | HR 76 | Temp 98.7°F | Resp 16 | Ht 72.0 in | Wt 200.0 lb

## 2015-05-12 DIAGNOSIS — G8929 Other chronic pain: Secondary | ICD-10-CM | POA: Insufficient documentation

## 2015-05-12 DIAGNOSIS — Z79899 Other long term (current) drug therapy: Secondary | ICD-10-CM

## 2015-05-12 DIAGNOSIS — Z79891 Long term (current) use of opiate analgesic: Secondary | ICD-10-CM

## 2015-05-12 DIAGNOSIS — M47816 Spondylosis without myelopathy or radiculopathy, lumbar region: Secondary | ICD-10-CM

## 2015-05-12 DIAGNOSIS — M79605 Pain in left leg: Secondary | ICD-10-CM | POA: Diagnosis not present

## 2015-05-12 DIAGNOSIS — F1120 Opioid dependence, uncomplicated: Secondary | ICD-10-CM

## 2015-05-12 DIAGNOSIS — F1190 Opioid use, unspecified, uncomplicated: Secondary | ICD-10-CM | POA: Diagnosis not present

## 2015-05-12 DIAGNOSIS — M545 Low back pain, unspecified: Secondary | ICD-10-CM

## 2015-05-12 DIAGNOSIS — Z7189 Other specified counseling: Secondary | ICD-10-CM

## 2015-05-12 DIAGNOSIS — M792 Neuralgia and neuritis, unspecified: Secondary | ICD-10-CM

## 2015-05-12 DIAGNOSIS — F17200 Nicotine dependence, unspecified, uncomplicated: Secondary | ICD-10-CM | POA: Insufficient documentation

## 2015-05-12 DIAGNOSIS — M549 Dorsalgia, unspecified: Secondary | ICD-10-CM | POA: Insufficient documentation

## 2015-05-12 DIAGNOSIS — Z5181 Encounter for therapeutic drug level monitoring: Secondary | ICD-10-CM

## 2015-05-12 MED ORDER — CYCLOBENZAPRINE HCL 10 MG PO TABS: 10.0000 mg | ORAL_TABLET | Freq: Every day | ORAL | Status: DC

## 2015-05-12 MED ORDER — GABAPENTIN 300 MG PO CAPS: ORAL_CAPSULE | ORAL | Status: DC

## 2015-05-12 MED ORDER — OXYCODONE HCL 10 MG PO TABS: 10.0000 mg | ORAL_TABLET | Freq: Four times a day (QID) | ORAL | Status: DC | PRN

## 2015-05-12 NOTE — Progress Notes (Signed)
Patient's Name: Ricardo Knox MRN: [id number] DOB: Mar 27, 1986 DOS: 05/12/2015  Primary Reason(s) for Visit: Encounter for Medication Management CC: Back Pain   HPI:    Ricardo Knox is a 29 y.o. year old, male patient, who returns today as an established patient. He has Lumbar spondylosis (L3-4 DDD by 2014 MRI); Encounter for therapeutic drug level monitoring; Long term current use of opiate analgesic; Uncomplicated opioid dependence (HCC); Opiate use; Lumbar facet syndrome (Left); Chronic low back pain (Location of Primary Source of Pain) (Left); Chronic pain; Cervical facet syndrome; Vitamin D deficiency; Cervical spondylosis; Neuropathic pain; Neurogenic pain; Chronic lower extremity pain (Left); Chronic knee pain (Left); Musculoskeletal pain; Long term prescription opiate use; Encounter for chronic pain management; and Opioid dependence, daily use (HCC) on his problem list.. His primarily concern today is the Back Pain     Patient returns to the clinic today for pharmacological management of his chronic pain. He apparently is short on his medications since he refers having taken the last one this morning but according to the PMP and our records, he should've had enough for 30 days, including today. He has requested an early refill, which was denied.  Today's Pain Score: 4  (last pain medicine this morning), clinically he looks like a 1/10. Reported level of pain is incompatible with clinical obrservations. This may be secondary to a possible lack of understanding on how the pain scale works.    Date of Last Visit: 04/13/15 Service Provided on Last Visit: Med Refill  Pharmacotherapy Review:   Side-effects or Adverse reactions: None reported Effectiveness: Described as relatively effective, allowing for increase in activities of daily living (ADL) Onset of action: Within expected pharmacological parameters Duration of action: Within normal limits for medication Peak effect: Timing and  results are as within normal expected parameters [place] PMP: Compliant with practice rules and regulations UDS Results: 04/13/2015 UDS was within normal limits. Results were as expected UDS Interpretation: Patient appears to be compliant with practice rules and regulations Medication Assessment Form: Reviewed. Patient indicates being compliant with therapy Treatment compliance: Compliant. Apparently he must have taken more than prescribed since he took his last pill this morning and he should've had enough for the entire day. Substance Use Disorder (SUD) Risk Level: Moderate Pharmacologic Plan: Continue therapy as is. Early refilled denied.  Lab Work: Inflammation Markers Lab Results  Component Value Date   ESRSEDRATE 1 01/20/2014    Renal Function Lab Results  Component Value Date   BUN 10 01/20/2014   CREATININE 0.79 01/20/2014   GFRAA >60 01/20/2014   GFRNONAA >60 01/20/2014    Hepatic Function Lab Results  Component Value Date   AST 20 01/20/2014   ALT 32 01/20/2014   ALBUMIN 4.5 01/20/2014    Electrolytes Lab Results  Component Value Date   NA 143 01/20/2014   K 4.1 01/20/2014   CL 108* 01/20/2014   CALCIUM 9.3 01/20/2014    Allergies:  Mr. Briles is allergic to hydrocodone.  Meds:  The patient has a current medication list which includes the following prescription(s): gabapentin, naproxen sodium, oxycodone hcl, cyclobenzaprine, and oxycodone hcl. Requested Prescriptions   Signed Prescriptions Disp Refills  . gabapentin (NEURONTIN) 300 MG capsule 90 capsule 1    Sig: Take 1 tablet at bedtime for 1 week, 2 tablets at bedtime on the following week, and then 3 tablets at bedtime on week 3.  . cyclobenzaprine (FLEXERIL) 10 MG tablet 30 tablet 1    Sig: Take 1 tablet (10  mg total) by mouth at bedtime. Reported on 05/12/2015  . Oxycodone HCl 10 MG TABS 120 tablet 0    Sig: Take 1 tablet (10 mg total) by mouth every 6 (six) hours as needed.  . Oxycodone HCl 10 MG  TABS 120 tablet 0    Sig: Take 1 tablet (10 mg total) by mouth every 6 (six) hours as needed.    ROS:  Constitutional: Afebrile, no chills, well hydrated and well nourished Gastrointestinal: negative Musculoskeletal:negative Neurological: negative Behavioral/Psych: negative  PFSH:  Medical:  Ricardo Knox  has a past medical history of Allergy; Tobacco use; History of herniorrhaphy; and Chronic back pain greater than 3 months duration. Family: family history includes High Cholesterol in his father; Kidney cancer in his father. Surgical:  has past surgical history that includes Appendectomy and Hernia repair. Tobacco:  reports that he has been smoking Cigarettes.  He has a 10 pack-year smoking history. He does not have any smokeless tobacco history on file. Alcohol:  reports that he does not drink alcohol. Drug:  reports that he does not use illicit drugs.  Physical Exam:  Vitals:  Today's Vitals   05/12/15 1048  BP: 114/86  Pulse: 76  Temp: 98.7 F (37.1 C)  TempSrc: Oral  Resp: 16  Height: 6' (1.829 m)  Weight: 200 lb (90.719 kg)  SpO2: 100%  PainSc: 4   Calculated BMI: Body mass index is 27.12 kg/(m^2). General appearance: alert, cooperative, appears stated age and no distress Eyes: PERLA Respiratory: No evidence respiratory distress, no audible rales or ronchi and no use of accessory muscles of respiration Neck: no adenopathy, no carotid bruit, no JVD, supple, symmetrical, trachea midline and thyroid not enlarged, symmetric, no tenderness/mass/nodules  Cervical Spine ROM: Adequate for flexion, extension, rotation, and lateral bending Palpation: No palpable trigger points  Upper Extremities ROM: Adequate bilaterally Strength: 5/5 for all flexors and extensors of the upper extremity, bilaterally Pulses: Palpable bilaterally Neurologic: No allodynia, No hyperesthesia, No hyperpathia and No sensory abnormalities  Lumbar Spine ROM: Adequate for flexion, extension,  rotation, and lateral bending Palpation: No palpable trigger points Lumbar Hyperextension and rotation: Non-contributory Patrick's Maneuver: Non-contributory  Lower Extremities ROM: Adequate bilaterally Strength: 5/5 for all flexors and extensors of the lower extremity, bilaterally Pulses: Palpable bilaterally Neurologic: No allodynia, No hyperesthesia, No hyperpathia, No sensory abnormalities and No antalgic gait or posture  Assessment:  Encounter Diagnosis:  Primary Diagnosis: Chronic pain [G89.29]  Plan:  Interventional Therapies: PRN procedure: Left lumbar epidural steroid injection under fluoroscopic guidance and IV sedation.  Arkin was seen today for back pain.  Diagnoses and all orders for this visit:  Chronic pain -     cyclobenzaprine (FLEXERIL) 10 MG tablet; Take 1 tablet (10 mg total) by mouth at bedtime. Reported on 05/12/2015 -     Oxycodone HCl 10 MG TABS; Take 1 tablet (10 mg total) by mouth every 6 (six) hours as needed. -     Oxycodone HCl 10 MG TABS; Take 1 tablet (10 mg total) by mouth every 6 (six) hours as needed.  Chronic low back pain  Chronic left lower extremity pain  Uncomplicated opioid dependence (HCC)  Opiate use  Long term current use of opiate analgesic  Encounter for therapeutic drug level monitoring  Neuropathic pain -     gabapentin (NEURONTIN) 300 MG capsule; Take 1 tablet at bedtime for 1 week, 2 tablets at bedtime on the following week, and then 3 tablets at bedtime on week 3.  Lumbar spondylosis (  L3-4 DDD by 2014 MRI)  Lumbar facet syndrome (Left)  Long term prescription opiate use  Encounter for chronic pain management  Opioid dependence, daily use [facility](HCC)     Patient Instructions  You were given 2 prescriptions for Oxycodone today.  Medications discontinued today:  Medications Discontinued During This Encounter  Medication Reason  . oxyCODONE (OXY IR/ROXICODONE) 5 MG immediate release tablet Change in therapy  .  metaxalone (SKELAXIN) 800 MG tablet   . gabapentin (NEURONTIN) 300 MG capsule Reorder  . cyclobenzaprine (FLEXERIL) 10 MG tablet Reorder  . Oxycodone HCl 10 MG TABS Reorder   Medications administered today:  [name] had no medications administered during this visit.  Primary Care Physician: Evelene CroonNIEMEYER, MEINDERT, MD [facility] Note by: Sydnee LevansFrancisco A. Laban EmperorNaveira, M.D, DABA, DABAPM, DABPM, DABIPP, FIPP

## 2015-05-12 NOTE — Patient Instructions (Signed)
You were given 2 prescriptions for Oxycodone today. 

## 2015-05-12 NOTE — Progress Notes (Signed)
Patient is here today for medication refill.  Did not receive Rx for flexeril or skelaxin last time would like to have one of these prescribed for HS.  Does not have any pain medication left in his bottle.

## 2015-07-11 ENCOUNTER — Encounter: Payer: Self-pay | Admitting: Pain Medicine

## 2015-07-11 ENCOUNTER — Ambulatory Visit: Payer: 59 | Attending: Pain Medicine | Admitting: Pain Medicine

## 2015-07-11 ENCOUNTER — Other Ambulatory Visit: Payer: Self-pay | Admitting: Pain Medicine

## 2015-07-11 VITALS — BP 148/78 | HR 88 | Temp 98.4°F | Resp 14 | Ht 72.0 in | Wt 200.0 lb

## 2015-07-11 DIAGNOSIS — Z5181 Encounter for therapeutic drug level monitoring: Secondary | ICD-10-CM | POA: Diagnosis not present

## 2015-07-11 DIAGNOSIS — M79605 Pain in left leg: Secondary | ICD-10-CM | POA: Diagnosis not present

## 2015-07-11 DIAGNOSIS — M791 Myalgia: Secondary | ICD-10-CM | POA: Diagnosis not present

## 2015-07-11 DIAGNOSIS — M47816 Spondylosis without myelopathy or radiculopathy, lumbar region: Secondary | ICD-10-CM | POA: Diagnosis not present

## 2015-07-11 DIAGNOSIS — F17210 Nicotine dependence, cigarettes, uncomplicated: Secondary | ICD-10-CM | POA: Diagnosis not present

## 2015-07-11 DIAGNOSIS — G8929 Other chronic pain: Secondary | ICD-10-CM | POA: Diagnosis not present

## 2015-07-11 DIAGNOSIS — F1190 Opioid use, unspecified, uncomplicated: Secondary | ICD-10-CM | POA: Insufficient documentation

## 2015-07-11 DIAGNOSIS — E559 Vitamin D deficiency, unspecified: Secondary | ICD-10-CM | POA: Diagnosis not present

## 2015-07-11 DIAGNOSIS — M545 Low back pain, unspecified: Secondary | ICD-10-CM

## 2015-07-11 DIAGNOSIS — M792 Neuralgia and neuritis, unspecified: Secondary | ICD-10-CM

## 2015-07-11 DIAGNOSIS — Z79891 Long term (current) use of opiate analgesic: Secondary | ICD-10-CM | POA: Diagnosis not present

## 2015-07-11 DIAGNOSIS — M549 Dorsalgia, unspecified: Secondary | ICD-10-CM | POA: Diagnosis present

## 2015-07-11 LAB — COMPREHENSIVE METABOLIC PANEL
ALBUMIN: 4.6 g/dL (ref 3.5–5.0)
ALK PHOS: 95 U/L (ref 38–126)
ALT: 48 U/L (ref 17–63)
AST: 28 U/L (ref 15–41)
Anion gap: 6 (ref 5–15)
BILIRUBIN TOTAL: 0.7 mg/dL (ref 0.3–1.2)
BUN: 11 mg/dL (ref 6–20)
CALCIUM: 9.4 mg/dL (ref 8.9–10.3)
CO2: 26 mmol/L (ref 22–32)
CREATININE: 0.76 mg/dL (ref 0.61–1.24)
Chloride: 106 mmol/L (ref 101–111)
GFR calc Af Amer: >60 mL/min (ref >60)
GFR calc non Af Amer: >60 mL/min (ref >60)
GLUCOSE: 96 mg/dL (ref 65–99)
Potassium: 4.6 mmol/L (ref 3.5–5.1)
SODIUM: 138 mmol/L (ref 135–145)
Total Protein: 7.7 g/dL (ref 6.5–8.1)

## 2015-07-11 LAB — SEDIMENTATION RATE: Sed Rate: 6 mm/hr (ref 0–15)

## 2015-07-11 LAB — C-REACTIVE PROTEIN: CRP: 1.1 mg/dL — ABNORMAL HIGH (ref <1.0)

## 2015-07-11 LAB — MAGNESIUM: Magnesium: 1.9 mg/dL (ref 1.7–2.4)

## 2015-07-11 MED ORDER — OXYCODONE HCL 10 MG PO TABS: 10.0000 mg | ORAL_TABLET | Freq: Four times a day (QID) | ORAL | Status: DC | PRN

## 2015-07-11 MED ORDER — GABAPENTIN 300 MG PO CAPS: ORAL_CAPSULE | ORAL | Status: DC

## 2015-07-11 MED ORDER — CYCLOBENZAPRINE HCL 10 MG PO TABS: 10.0000 mg | ORAL_TABLET | Freq: Every day | ORAL | Status: DC

## 2015-07-11 NOTE — Progress Notes (Signed)
Quick Note:  Lab results reviewed and found to be within normal limits. ______ 

## 2015-07-11 NOTE — Progress Notes (Signed)
Pill count Oxycodone - #2 

## 2015-07-11 NOTE — Progress Notes (Signed)

## 2015-07-11 NOTE — Progress Notes (Signed)
Patient's Name: Ricardo Knox MRN: [id number] DOB: [birth year] DOS: 07/11/2015  Primary Reason(s) for Visit: Encounter for Medication Management CC: Back Pain   HPI  Ricardo Knox is a 30 y.o. year old, male patient, who returns today as an established patient. He has Lumbar spondylosis (L3-4 DDD by 2014 MRI); Encounter for therapeutic drug level monitoring; Long term current use of opiate analgesic; Uncomplicated opioid dependence (HCC); Opiate use; Lumbar facet syndrome (Location of Primary Source of Pain) (Bilateral) (L>R); Chronic low back pain (Location of Primary Source of Pain) (Left); Chronic pain; Cervical facet syndrome; Vitamin D deficiency; Cervical spondylosis; Neuropathic pain; Neurogenic pain; Chronic lower extremity pain (Location of Secondary source of pain) (Left); Chronic knee pain (Left); Musculoskeletal pain; Long term prescription opiate use; Encounter for chronic pain management; and Opioid dependence, daily use (HCC) on his problem list.. His primarily concern today is the Back Pain   The patient returns to the clinics today for pharmacological management of his chronic pain. His primary pain is in the lower back with the left being worst on the right. The secondary pain is to left lower extremity. This leg pain is intermittent and it goes down to the knee through the lateral and anterior portion of the thigh. In the past patient has had some lumbar facet blocks with good results. Today we talked to him about the possibility of radiofrequency ablation.  Reported Pain Score: 5  Reported level is inconsistent with clinical obrservations. Pain Type: Chronic pain Pain Location: Back Pain Orientation: Left, Lower Pain Descriptors / Indicators: Aching, Sharp Pain Frequency: Intermittent  Date of Last Visit: 04/13/15 Service Provided on Last Visit: Med Refill  Pharmacotherapy  Medication(s): Oxycodone 10 1 tablet by mouth every 6 hours when necessary for pain Onset of  action: Within expected pharmacological parameters. (30 minutes) Time to Peak effect: Timing and results are as within normal expected parameters. (45 minutes to one hour) Analgesic Effect: 75% Activity Facilitation: Medication(s) allow patient to sit, stand, walk, and do the basic ADLs Perceived Effectiveness: Described as relatively effective, allowing for increase in activities of daily living (ADL) Side-effects or Adverse reactions: None reported Duration of action: Within normal limits for medication. (4 hours) [place] PMP: Compliant with practice rules and regulations UDS Results: Last UDS done on 03/14/2015 and it came back within normal limits with no unexpected results. UDS Interpretation: Patient appears to be compliant with practice rules and regulations Medication Assessment Form: Reviewed. Patient indicates being compliant with therapy Treatment compliance: Compliant Substance Use Disorder (SUD) Risk Level: Low Pharmacologic Plan: Continue therapy as is  Lab Work: Illicit Drugs No results found for: THCU, COCAINSCRNUR, PCPSCRNUR, MDMA, AMPHETMU, METHADONE, ETOH  Inflammation Markers Lab Results  Component Value Date   ESRSEDRATE 6 07/11/2015    Renal Function Lab Results  Component Value Date   BUN 11 07/11/2015   CREATININE 0.76 07/11/2015   GFRAA >60 07/11/2015   GFRNONAA >60 07/11/2015    Hepatic Function Lab Results  Component Value Date   AST 28 07/11/2015   ALT 48 07/11/2015   ALBUMIN 4.6 07/11/2015    Electrolytes Lab Results  Component Value Date   NA 138 07/11/2015   K 4.6 07/11/2015   CL 106 07/11/2015   CALCIUM 9.4 07/11/2015   MG 1.9 07/11/2015    Allergies  [name] is allergic to hydrocodone.  Meds  The patient has a current medication list which includes the following prescription(s): cyclobenzaprine, gabapentin, naproxen sodium, oxycodone hcl, and oxycodone hcl.  Current  Outpatient Prescriptions on File Prior to Visit  Medication  Sig  . naproxen sodium (ANAPROX) 220 MG tablet Take 220 mg by mouth as needed.    No current facility-administered medications on file prior to visit.    ROS  Constitutional: Afebrile, no chills, well hydrated and well nourished Gastrointestinal: negative Musculoskeletal:negative Neurological: negative Behavioral/Psych: negative  PFSH  Medical:  [name]  has a past medical history of Allergy; Tobacco use; History of herniorrhaphy; and Chronic back pain greater than 3 months duration. Family: family history includes High Cholesterol in his father; Kidney cancer in his father. Surgical:  has past surgical history that includes Appendectomy and Hernia repair. Tobacco:  reports that he has been smoking Cigarettes.  He has a 10 pack-year smoking history. He does not have any smokeless tobacco history on file. Alcohol:  reports that he does not drink alcohol. Drug:  reports that he does not use illicit drugs.  Physical Exam  Vitals:  Today's Vitals   07/11/15 0929  BP: 148/78  Pulse: 88  Temp: 98.4 F (36.9 C)  TempSrc: Oral  Resp: 14  Height: 6' (1.829 m)  Weight: 200 lb (90.719 kg)  SpO2: 100%  PainSc: 5   PainLoc: Back    Calculated BMI: Body mass index is 27.12 kg/(m^2).  General appearance: alert, cooperative, appears stated age and no distress Eyes: PERLA Respiratory: No evidence respiratory distress, no audible rales or ronchi and no use of accessory muscles of respiration  Cervical Spine Inspection: Normal anatomy Alignment: Symetrical ROM: Adequate  Upper Extremities Inspection: No gross anomalies detected ROM: Adequate Sensory: Normal Motor: Unremarkable  Thoracic Spine Inspection: No gross anomalies detected Alignment: Symetrical ROM: Adequate Palpation: WNL  Lumbar Spine Inspection: No gross anomalies detected Alignment: Symetrical ROM: Decreased Palpation: Tender Provocative Tests:  Lumbar Hyperextension and rotation test:  Positive  bilaterally Patrick's Maneuver: deferred Gait: WNL  Lower Extremities Inspection: No gross anomalies detected ROM: Adequate Sensory:  Normal Motor: Unremarkable  Toe walk (S1): WNL  Heal walk (L5): WNL  Assessment & Plan  Primary Diagnosis & Pertinent Problem List: The primary encounter diagnosis was Chronic pain. Diagnoses of Encounter for therapeutic drug level monitoring, Long term current use of opiate analgesic, Chronic low back pain (Location of Primary Source of Pain) (Left), Neuropathic pain, Lumbar facet syndrome (Left), and Chronic lower extremity pain (Location of Secondary source of pain) (Left) were also pertinent to this visit.  Visit Diagnosis: 1. Chronic pain   2. Encounter for therapeutic drug level monitoring   3. Long term current use of opiate analgesic   4. Chronic low back pain (Location of Primary Source of Pain) (Left)   5. Neuropathic pain   6. Lumbar facet syndrome (Left)   7. Chronic lower extremity pain (Location of Secondary source of pain) (Left)     Assessment: No problem-specific assessment & plan notes found for this encounter.   Plan of Care  Pharmacotherapy (Medications Ordered): Meds ordered this encounter  Medications  . cyclobenzaprine (FLEXERIL) 10 MG tablet    Sig: Take 1 tablet (10 mg total) by mouth at bedtime. Reported on 05/12/2015    Dispense:  30 tablet    Refill:  1    Do not place this medication, or any other prescription from our practice, on "Automatic Refill". Patient may have prescription filled one day early if pharmacy is closed on scheduled refill date.  . Oxycodone HCl 10 MG TABS    Sig: Take 1 tablet (10 mg total) by mouth  every 6 (six) hours as needed.    Dispense:  120 tablet    Refill:  0    Do not place this medication, or any other prescription from our practice, on "Automatic Refill". Patient may have prescription filled one day early if pharmacy is closed on scheduled refill date. Do not fill until:  07/12/15 To last until: 08/11/15  . Oxycodone HCl 10 MG TABS    Sig: Take 1 tablet (10 mg total) by mouth every 6 (six) hours as needed.    Dispense:  120 tablet    Refill:  0    Do not place this medication, or any other prescription from our practice, on "Automatic Refill". Patient may have prescription filled one day early if pharmacy is closed on scheduled refill date. Do not fill until: 08/11/15 To last until: 09/10/15  . gabapentin (NEURONTIN) 300 MG capsule    Sig: Take 1 tablet at bedtime for 1 week, 2 tablets at bedtime on the following week, and then 3 tablets at bedtime on week 3.    Dispense:  90 capsule    Refill:  1    Do not place this medication, or any other prescription from our practice, on "Automatic Refill". Patient may have prescription filled one day early if pharmacy is closed on scheduled refill date.    Lab-work & Procedure Ordered: Orders Placed This Encounter  Procedures  . LUMBAR EPIDURAL STEROID INJECTION    Standing Status: Standing     Number of Occurrences: 1     Standing Expiration Date: 07/10/2016    Scheduling Instructions:     Side: Left-sided     Sedation: With Sedation.     Timeframe: PRN Procedure. Patient will call to schedule.    Order Specific Question:  Where will this procedure be performed?    Answer:  [facility]  . LUMBAR FACET(MEDIAL BRANCH NERVE BLOCK) MBNB    Standing Status: Standing     Number of Occurrences: 1     Standing Expiration Date: 07/10/2016    Scheduling Instructions:     Side: Bilateral     Level: L2, L3, L4, L5, & S1 Medial Branch Nerve     Sedation: With Sedation.     Timeframe: PRN Procedure. Patient will call to schedule.    Order Specific Question:  Where will this procedure be performed?    Answer:  [facility]  . Drugs of abuse screen w/o alc, rtn urine-sln    Volume: 10 ml(s). Minimum 3 ml of urine is needed. Document temperature of fresh sample. Indications: Long term (current) use of  opiate analgesic (Z79.891) Test#: [id number] ([employer]ox[employer] Select-13)  . Comprehensive metabolic panel    Order Specific Question:  Has the patient fasted?    Answer:  No  . C-reactive protein  . Magnesium  . Sedimentation rate  . Vitamin B12    Indication: Bone Pain (M89.9)  . Vitamin D pnl(25-hydrxy+1,25-dihy)-bld    Imaging Ordered: None  Interventional Therapies: Scheduled: None at this time. PRN Procedures:  1. For the low back pain, bilateral lumbar facet block under fluoroscopic guidance and IV sedation.  2. For the left lower extremity pain, left lumbar epidural steroid injection under fluoroscopic guidance and IV sedation.  3. In addition, we are considering lumbar facet radiofrequency ablation.    Referral(s) or Consult(s): None at this point.  Medications administered during this visit: Mr. Caponi had no medications administered during this visit.  Future Appointments Date Time Provider Department  Center  09/07/2015 11:20 AM Delano Metz, MD [facility] None    Primary Care Physician: Evelene Croon, MD Location: [facility] Facility Note by: Sydnee Levans Laban Emperor, M.D, DABA, DABAPM, DABPM, DABIPP, FIPP

## 2015-07-11 NOTE — Patient Instructions (Signed)
Epidural Steroid Injection Patient Information  Description: The epidural space surrounds the nerves as they exit the spinal cord.  In some patients, the nerves can be compressed and inflamed by a bulging disc or a tight spinal canal (spinal stenosis).  By injecting steroids into the epidural space, we can bring irritated nerves into direct contact with a potentially helpful medication.  These steroids act directly on the irritated nerves and can reduce swelling and inflammation which often leads to decreased pain.  Epidural steroids may be injected anywhere along the spine and from the neck to the low back depending upon the location of your pain.   After numbing the skin with local anesthetic (like Novocaine), a small needle is passed into the epidural space slowly.  You may experience a sensation of pressure while this is being done.  The entire block usually last less than 10 minutes.  Conditions which may be treated by epidural steroids:   Low back and leg pain  Neck and arm pain  Spinal stenosis  Post-laminectomy syndrome  Herpes zoster (shingles) pain  Pain from compression fractures  Preparation for the injection:  1. Do not eat any solid food or dairy products within 6 hours of your appointment.  2. You may drink clear liquids up to 2 hours before appointment.  Clear liquids include water, black coffee, juice or soda.  No milk or cream please. 3. You may take your regular medication, including pain medications, with a sip of water before your appointment  Diabetics should hold regular insulin (if taken separately) and take 1/2 normal NPH dos the morning of the procedure.  Carry some sugar containing items with you to your appointment. 4. A driver must accompany you and be prepared to drive you home after your procedure.  5. Bring all your current medications with your. 6. An IV may be inserted and sedation may be given at the discretion of the physician.   7. A blood pressure  cuff, EKG and other monitors will often be applied during the procedure.  Some patients may need to have extra oxygen administered for a short period. 8. You will be asked to provide medical information, including your allergies, prior to the procedure.  We must know immediately if you are taking blood thinners (like Coumadin/Warfarin)  Or if you are allergic to IV iodine contrast (dye). We must know if you could possible be pregnant.  Possible side-effects:  Bleeding from needle site  Infection (rare, may require surgery)  Nerve injury (rare)  Numbness & tingling (temporary)  Difficulty urinating (rare, temporary)  Spinal headache ( a headache worse with upright posture)  Light -headedness (temporary)  Pain at injection site (several days)  Decreased blood pressure (temporary)  Weakness in arm/leg (temporary)  Pressure sensation in back/neck (temporary)  Call if you experience:  Fever/chills associated with headache or increased back/neck pain.  Headache worsened by an upright position.  New onset weakness or numbness of an extremity below the injection site  Hives or difficulty breathing (go to the emergency room)  Inflammation or drainage at the infection site  Severe back/neck pain  Any new symptoms which are concerning to you  Please note:  Although the local anesthetic injected can often make your back or neck feel good for several hours after the injection, the pain will likely return.  It takes 3-7 days for steroids to work in the epidural space.  You may not notice any pain relief for at least that one week.    If effective, we will often do a series of three injections spaced 3-6 weeks apart to maximally decrease your pain.  After the initial series, we generally will wait several months before considering a repeat injection of the same type.  If you have any questions, please call [phone] [facility]  Facet  Blocks Patient Information  Description: The facets are joints in the spine between the vertebrae.  Like any joints in the body, facets can become irritated and painful.  Arthritis can also effect the facets.  By injecting steroids and local anesthetic in and around these joints, we can temporarily block the nerve supply to them.  Steroids act directly on irritated nerves and tissues to reduce selling and inflammation which often leads to decreased pain.  Facet blocks may be done anywhere along the spine from the neck to the low back depending upon the location of your pain.   After numbing the skin with local anesthetic (like Novocaine), a small needle is passed onto the facet joints under x-ray guidance.  You may experience a sensation of pressure while this is being done.  The entire block usually lasts about 15-25 minutes.   Conditions which may be treated by facet blocks:   Low back/buttock pain  Neck/shoulder pain  Certain types of headaches  Preparation for the injection:  12. Do not eat any solid food or dairy products within 6 hours of your appointment. 13. You may drink clear liquid up to 2 hours before appointment.  Clear liquids include water, black coffee, juice or soda.  No milk or cream please. 14. You may take your regular medication, including pain medications, with a sip of water before your appointment.  Diabetics should hold regular insulin (if taken separately) and take 1/2 normal NPH dose the morning of the procedure.  Carry some sugar containing items with you to your appointment. 15. A driver must accompany you and be prepared to drive you home after your procedure. 16. Bring all your current medications with you. 17. An IV may be inserted and sedation may be given at the discretion of the physician. 18. A blood pressure cuff, EKG and other monitors will often be applied during the procedure.  Some patients may need to have extra oxygen administered for a short  period. 19. You will be asked to provide medical information, including your allergies and medications, prior to the procedure.  We must know immediately if you are taking blood thinners (like Coumadin/Warfarin) or if you are allergic to IV iodine contrast (dye).  We must know if you could possible be pregnant.  Possible side-effects:   Bleeding from needle site  Infection (rare, may require surgery)  Nerve injury (rare)  Numbness & tingling (temporary)  Difficulty urinating (rare, temporary)  Spinal headache (a headache worse with upright posture)  Light-headedness (temporary)  Pain at injection site (serveral days)  Decreased blood pressure (rare, temporary)  Weakness in arm/leg (temporary)  Pressure sensation in back/neck (temporary)   Call if you experience:   Fever/chills associated with headache or increased back/neck pain  Headache worsened by an upright position  New onset, weakness or numbness of an extremity below the injection site  Hives or difficulty breathing (go to the emergency room)  Inflammation or drainage at the injection site(s)  Severe back/neck pain greater than usual  New symptoms which are concerning to you  Please note:  Although the local anesthetic injected can often make your back or neck feel  good for several hours after the injection, the pain will likely return. It takes 3-7 days for steroids to work.  You may not notice any pain relief for at least one week.  If effective, we will often do a series of 2-3 injections spaced 3-6 weeks apart to maximally decrease your pain.  After the initial series, you may be a candidate for a more permanent nerve block of the facets.  If you have any questions, please call #[phone] [facility]  A prescription for GABAPENTIN, FLEXERIL was sent to your pharmacy and should be available for pickup today.  A prescription for OXYCODONE was given to you  today.  Please get lab work done as soon as possible.

## 2015-07-16 LAB — TOXASSURE SELECT 13 (MW), URINE: PDF: 0

## 2015-07-21 ENCOUNTER — Telehealth: Payer: Self-pay | Admitting: Pain Medicine

## 2015-07-21 NOTE — Telephone Encounter (Signed)
Was able to reach pharmacy and clarify order.

## 2015-07-21 NOTE — Telephone Encounter (Signed)
Still needs clarification on gabapentin script / please call [facility] so they can fill

## 2015-07-21 NOTE — Telephone Encounter (Signed)
Attempted to call [facility] to clarify x 4.  No answer.  No choice to leave message.  Will try again later.

## 2015-07-24 ENCOUNTER — Encounter: Payer: Self-pay | Admitting: Pain Medicine

## 2015-07-24 NOTE — Progress Notes (Signed)
Quick Note:  Positive, undisclosed use of illicit substance (Cannabinoids). Our program has a "Zero Tolerance" for the use of illicit substances. As a consequence of the results obtained on this test, we will no longer offer controlled substances as a therapeutic option for this patient, until the UDS return clear. In addition, we will check to see if this is a recurrent event. Today the patient will be given a final warning and informed that should this event happen again, we will no longer continue to offer opioids as an alternative treatment option. In the event that our records reveal that this warning had previously been given to the patient, we will then immediately discontinue this mode of therapy. ______ 

## 2015-09-07 ENCOUNTER — Encounter: Payer: 59 | Admitting: Pain Medicine

## 2017-08-06 ENCOUNTER — Encounter: Payer: Self-pay | Admitting: "Endocrinology

## 2017-08-06 LAB — TSH: TSH: 0.21 — AB (ref <=5.90)

## 2017-09-16 ENCOUNTER — Ambulatory Visit (INDEPENDENT_AMBULATORY_CARE_PROVIDER_SITE_OTHER): Payer: BLUE CROSS/BLUE SHIELD | Admitting: "Endocrinology

## 2017-09-16 ENCOUNTER — Encounter: Payer: Self-pay | Admitting: "Endocrinology

## 2017-09-16 VITALS — BP 117/84 | HR 78 | Ht 71.0 in | Wt 189.0 lb

## 2017-09-16 DIAGNOSIS — E0590 Thyrotoxicosis, unspecified without thyrotoxic crisis or storm: Secondary | ICD-10-CM | POA: Diagnosis not present

## 2017-09-16 NOTE — Progress Notes (Signed)
Endocrinology Consult Note                                            09/16/2017, 5:53 PM   Subjective:    Patient ID: Ricardo Knox, male    DOB: 05-19-1986, PCP Erasmo DownerStrader, Lindsey F, NP   Past Medical History:  Diagnosis Date  . Allergy   . Chronic back pain greater than 3 months duration   . History of herniorrhaphy   . Tobacco use    Past Surgical History:  Procedure Laterality Date  . APPENDECTOMY    . HERNIA REPAIR     Social History   Socioeconomic History  . Marital status: Single    Spouse name: Not on file  . Number of children: Not on file  . Years of education: Not on file  . Highest education level: Not on file  Occupational History  . Not on file  Social Needs  . Financial resource strain: Not on file  . Food insecurity:    Worry: Not on file    Inability: Not on file  . Transportation needs:    Medical: Not on file    Non-medical: Not on file  Tobacco Use  . Smoking status: Current Every Day Smoker    Packs/day: 1.50    Years: 10.00    Pack years: 15.00    Types: Cigarettes  . Smokeless tobacco: Never Used  Substance and Sexual Activity  . Alcohol use: No    Alcohol/week: 0.0 oz  . Drug use: No  . Sexual activity: Not on file  Lifestyle  . Physical activity:    Days per week: Not on file    Minutes per session: Not on file  . Stress: Not on file  Relationships  . Social connections:    Talks on phone: Not on file    Gets together: Not on file    Attends religious service: Not on file    Active member of club or organization: Not on file    Attends meetings of clubs or organizations: Not on file    Relationship status: Not on file  Other Topics Concern  . Not on file  Social History Narrative  . Not on file   Outpatient Encounter Medications as of 09/16/2017  Medication Sig  . buPROPion (WELLBUTRIN XL) 150 MG 24 hr tablet every morning.  . venlafaxine XR (EFFEXOR-XR) 75 MG 24 hr capsule every morning.  . [DISCONTINUED]  cyclobenzaprine (FLEXERIL) 10 MG tablet Take 1 tablet (10 mg total) by mouth at bedtime. Reported on 05/12/2015  . [DISCONTINUED] gabapentin (NEURONTIN) 300 MG capsule Take 1 tablet at bedtime for 1 week, 2 tablets at bedtime on the following week, and then 3 tablets at bedtime on week 3.  . [DISCONTINUED] naproxen sodium (ANAPROX) 220 MG tablet Take 220 mg by mouth as needed.   . [DISCONTINUED] Oxycodone HCl 10 MG TABS Take 1 tablet (10 mg total) by mouth every 6 (six) hours as needed.  . [DISCONTINUED] Oxycodone HCl 10 MG TABS Take 1 tablet (10 mg total) by mouth every 6 (six) hours as needed.   No facility-administered encounter medications on file as of 09/16/2017.    ALLERGIES: Allergies  Allergen Reactions  . Hydrocodone     VACCINATION STATUS:  There is no immunization history on file for this patient.  HPI  Ricardo Knox is 32 y.o. male who presents today with a medical history as above. he is being seen in consultation for subclinical hyperthyroidism requested by Erasmo Downer, NP.  he has been dealing with symptoms of on and off anxiety, on and off palpitations, and sleep disturbance.  He has lost approximately 10 pounds over a period of 1 year. He underwent thyroid function test on August 06, 2017 showing free T4 low normal at 0.9 associated with suppressed TSH of 0.21.  He reports some unidentified thyroid dysfunction in 1 of his aunts.  He denies any exposure to thyroid hormone or antithyroid medications. He denies dysphagia, shortness of breath, change in voice.  He denies heat/cold intolerance.  Review of Systems  Constitutional: +weight loss, + fatigue, - subjective hyperthermia, no subjective hypothermia Eyes: no blurry vision, no xerophthalmia ENT: no sore throat, no nodules palpated in throat, no dysphagia/odynophagia, no hoarseness Cardiovascular: no Chest Pain, no Shortness of Breath, no palpitations, no leg swelling Respiratory: no cough, no  SOB Gastrointestinal: no Nausea/Vomiting/Diarhhea Musculoskeletal: no muscle/joint aches Skin: no rashes Neurological: no tremors, no numbness, no tingling, no dizziness Psychiatric: no depression, no anxiety  Objective:    BP 117/84   Pulse 78   Ht 5\' 11"  (1.803 m)   Wt 189 lb (85.7 kg)   BMI 26.36 kg/m   Wt Readings from Last 3 Encounters:  09/16/17 189 lb (85.7 kg)  07/11/15 200 lb (90.7 kg)  05/12/15 200 lb (90.7 kg)    Physical Exam  Constitutional: + Appropriate weight for height, + slightly anxious state of mind.  Not in acute distress. Eyes: PERRLA, EOMI, no exophthalmos ENT: moist mucous membranes, no thyromegaly, no cervical lymphadenopathy Cardiovascular: normal precordial activity, Regular Rate and Rhythm, no Murmur/Rubs/Gallops Respiratory:  adequate breathing efforts, no gross chest deformity, Clear to auscultation bilaterally Gastrointestinal: abdomen soft, Non -tender, No distension, Bowel Sounds present Musculoskeletal: no gross deformities, strength intact in all four extremities Skin: moist, warm, no rashes Neurological: no tremor with outstretched hands, Deep tendon reflexes normal in all four extremities.  CMP ( most recent) CMP     Component Value Date/Time   NA 138 07/11/2015 1058   NA 143 01/20/2014 1642   K 4.6 07/11/2015 1058   K 4.1 01/20/2014 1642   CL 106 07/11/2015 1058   CL 108 (H) 01/20/2014 1642   CO2 26 07/11/2015 1058   CO2 25 01/20/2014 1642   GLUCOSE 96 07/11/2015 1058   GLUCOSE 94 01/20/2014 1642   BUN 11 07/11/2015 1058   BUN 10 01/20/2014 1642   CREATININE 0.76 07/11/2015 1058   CREATININE 0.79 01/20/2014 1642   CALCIUM 9.4 07/11/2015 1058   CALCIUM 9.3 01/20/2014 1642   PROT 7.7 07/11/2015 1058   PROT 8.0 01/20/2014 1642   ALBUMIN 4.6 07/11/2015 1058   ALBUMIN 4.5 01/20/2014 1642   AST 28 07/11/2015 1058   AST 20 01/20/2014 1642   ALT 48 07/11/2015 1058   ALT 32 01/20/2014 1642   ALKPHOS 95 07/11/2015 1058   ALKPHOS  76 01/20/2014 1642   BILITOT 0.7 07/11/2015 1058   BILITOT 0.5 01/20/2014 1642   GFRNONAA >60 07/11/2015 1058   GFRNONAA >60 01/20/2014 1642   GFRAA >60 07/11/2015 1058   GFRAA >60 01/20/2014 1642   August 06, 2017 labs: Free T4 0.9 (normal 0.8-1.8), TSH 0.2 1 (normal 0.4-4.5)   Assessment & Plan:   1. Subclinical hyperthyroidism  - ELION HOCKER  is being seen at a kind  request of Erasmo Downer, NP. - I have reviewed his available thyroid records and clinically evaluated the patient.  - Based on  reviews, he has subclinical hyperthyroidism.  -He does have some subtle clinical signs/symptoms of thyrotoxicosis. This is not sufficient information to proceed with treatment plan.  -he will need a repeat,  more complete thyroid function tests towards confirming the diagnosis.  -Will be sent to lab today for TSH, free T4/free T3, and antithyroid antibodies.  -he will return in 1 week to review his repeat labs.   If her labs are suggestive of hyperthyroidism, she would be considered for thyroid uptake and scan to confirm diagnosis.  - I did not initiate any new prescriptions today. -He is extensively counseled against smoking. - I advised patient to maintain close follow up with Erasmo Downer, NP for primary care needs.   - Time spent with the patient: 45 minutes, of which >50% was spent in obtaining information about his symptoms, reviewing his previous labs, evaluations, and treatments, counseling him about his abnormal thyroid function tests, chronic heavy smoking, and developing a plan to confirm the diagnosis and long term treatment as necessary.  Ricardo Griffins participated in the discussions, expressed understanding, and voiced agreement with the above plans.  All questions were answered to his satisfaction. he is encouraged to contact clinic should he have any questions or concerns prior to his return visit.  Follow up plan: Return in about 1 week (around  09/23/2017) for follow up with pre-visit labs, labs today.   Marquis Lunch, MD [facility] [address] [place], [place] [address] Phone: [phone]  Fax: [phone]     09/16/2017, 5:53 PM  This note was partially dictated with voice recognition software. Similar sounding words can be transcribed inadequately or may not  be corrected upon review.

## 2017-09-17 LAB — THYROID PEROXIDASE ANTIBODY: Thyroperoxidase Ab SerPl-aCnc: <1 IU/mL (ref <9)

## 2017-09-17 LAB — THYROGLOBULIN ANTIBODY: Thyroglobulin Ab: <1 IU/mL (ref <=1)

## 2017-09-17 LAB — TSH: TSH: 0.34 mIU/L — ABNORMAL LOW (ref 0.40–4.50)

## 2017-09-17 LAB — T3, FREE: T3, Free: 3.6 pg/mL (ref 2.3–4.2)

## 2017-09-17 LAB — T4, FREE: FREE T4: 1.2 ng/dL (ref 0.8–1.8)

## 2017-09-25 ENCOUNTER — Ambulatory Visit (INDEPENDENT_AMBULATORY_CARE_PROVIDER_SITE_OTHER): Payer: BLUE CROSS/BLUE SHIELD | Admitting: "Endocrinology

## 2017-09-25 ENCOUNTER — Encounter: Payer: Self-pay | Admitting: "Endocrinology

## 2017-09-25 VITALS — BP 129/77 | HR 85 | Ht 71.0 in | Wt 185.0 lb

## 2017-09-25 DIAGNOSIS — F17200 Nicotine dependence, unspecified, uncomplicated: Secondary | ICD-10-CM | POA: Diagnosis not present

## 2017-09-25 DIAGNOSIS — E0590 Thyrotoxicosis, unspecified without thyrotoxic crisis or storm: Secondary | ICD-10-CM | POA: Diagnosis not present

## 2017-09-25 MED ORDER — METHIMAZOLE 5 MG PO TABS: 5.0000 mg | ORAL_TABLET | Freq: Every day | ORAL | 3 refills | Status: DC

## 2017-09-25 NOTE — Progress Notes (Signed)
Endocrinology follow-up note                                            09/25/2017, 6:07 PM   Subjective:    Patient ID: Ricardo Knox, male    DOB: 1985/11/26, PCP Erasmo Downer, NP   Past Medical History:  Diagnosis Date  . Allergy   . Chronic back pain greater than 3 months duration   . History of herniorrhaphy   . Tobacco use    Past Surgical History:  Procedure Laterality Date  . APPENDECTOMY    . HERNIA REPAIR     Social History   Socioeconomic History  . Marital status: Single    Spouse name: Not on file  . Number of children: Not on file  . Years of education: Not on file  . Highest education level: Not on file  Occupational History  . Not on file  Social Needs  . Financial resource strain: Not on file  . Food insecurity:    Worry: Not on file    Inability: Not on file  . Transportation needs:    Medical: Not on file    Non-medical: Not on file  Tobacco Use  . Smoking status: Current Every Day Smoker    Packs/day: 1.50    Years: 10.00    Pack years: 15.00    Types: Cigarettes  . Smokeless tobacco: Never Used  Substance and Sexual Activity  . Alcohol use: No    Alcohol/week: 0.0 oz  . Drug use: No  . Sexual activity: Not on file  Lifestyle  . Physical activity:    Days per week: Not on file    Minutes per session: Not on file  . Stress: Not on file  Relationships  . Social connections:    Talks on phone: Not on file    Gets together: Not on file    Attends religious service: Not on file    Active member of club or organization: Not on file    Attends meetings of clubs or organizations: Not on file    Relationship status: Not on file  Other Topics Concern  . Not on file  Social History Narrative  . Not on file   Outpatient Encounter Medications as of 09/25/2017  Medication Sig  . buPROPion (WELLBUTRIN XL) 150 MG 24 hr tablet every morning.  . methimazole (TAPAZOLE) 5 MG tablet Take 1 tablet (5 mg total) by mouth daily.  Marland Kitchen  venlafaxine XR (EFFEXOR-XR) 75 MG 24 hr capsule every morning.   No facility-administered encounter medications on file as of 09/25/2017.    ALLERGIES: Allergies  Allergen Reactions  . Hydrocodone     VACCINATION STATUS:  There is no immunization history on file for this patient.  HPI Ricardo Knox is 32 y.o. male who presents today with a medical history as above. he is returning with full set of thyroid function test after he was seen in consultation for subclinical hyperthyroidism.   -He has no new complaints today, however  he has been dealing with symptoms of on and off anxiety, on and off palpitations, and sleep disturbance.  He has lost approximately 15 pounds over a period of 1 year. -His repeat thyroid function tests are still consistent with subclinical hyperthyroidism. -  He reports some unidentified thyroid dysfunction in 1 of his  aunts.  He denies any exposure to thyroid hormone or antithyroid medications. He denies dysphagia, shortness of breath, change in voice.  He denies heat/cold intolerance.  He continues to smoke heavily.  Review of Systems  Constitutional: +weight loss, + fatigue, - subjective hyperthermia, no subjective hypothermia Eyes: no blurry vision, no xerophthalmia ENT: no sore throat, no nodules palpated in throat, no dysphagia/odynophagia, no hoarseness Cardiovascular: no Chest Pain, no Shortness of Breath, no palpitations, no leg swelling Respiratory: no cough, no SOB Gastrointestinal: no Nausea/Vomiting/Diarhhea Musculoskeletal: no muscle/joint aches Skin: no rashes Neurological: no tremors, no numbness, no tingling, no dizziness Psychiatric: no depression, no anxiety  Objective:    BP 129/77   Pulse 85   Ht  (1.803 m)   Wt 185 lb (83.9 kg)   BMI 25.80 kg/m   Wt Readings from Last 3 Encounters:  09/25/17 185 lb (83.9 kg)  09/16/17 189 lb (85.7 kg)  07/11/15 200 lb (90.7 kg)    Physical Exam  Constitutional: + Appropriate weight  for height, slightly anxious   Not in acute distress. Eyes: PERRLA, EOMI, no exophthalmos ENT: moist mucous membranes, no thyromegaly, no cervical lymphadenopathy Musculoskeletal: no gross deformities, strength intact in all four extremities Skin: moist, warm, no rashes Neurological: no tremor with outstretched hands  CMP ( most recent) CMP     Component Value Date/Time   NA 138 07/11/2015 1058   NA 143 01/20/2014 1642   K 4.6 07/11/2015 1058   K 4.1 01/20/2014 1642   CL 106 07/11/2015 1058   CL 108 (H) 01/20/2014 1642   CO2 26 07/11/2015 1058   CO2 25 01/20/2014 1642   GLUCOSE 96 07/11/2015 1058   GLUCOSE 94 01/20/2014 1642   BUN 11 07/11/2015 1058   BUN 10 01/20/2014 1642   CREATININE 0.76 07/11/2015 1058   CREATININE 0.79 01/20/2014 1642   CALCIUM 9.4 07/11/2015 1058   CALCIUM 9.3 01/20/2014 1642   PROT 7.7 07/11/2015 1058   PROT 8.0 01/20/2014 1642   ALBUMIN 4.6 07/11/2015 1058   ALBUMIN 4.5 01/20/2014 1642   AST 28 07/11/2015 1058   AST 20 01/20/2014 1642   ALT 48 07/11/2015 1058   ALT 32 01/20/2014 1642   ALKPHOS 95 07/11/2015 1058   ALKPHOS 76 01/20/2014 1642   BILITOT 0.7 07/11/2015 1058   BILITOT 0.5 01/20/2014 1642   GFRNONAA >60 07/11/2015 1058   GFRNONAA >60 01/20/2014 1642   GFRAA >60 07/11/2015 1058   GFRAA >60 01/20/2014 1642   Recent Results (from the past 2160 hour(s))  TSH     Status: Abnormal   Collection Time: 08/06/17 12:00 AM  Result Value Ref Range   TSH 0.21 (A) 0.41 - 5.90    Comment: Free T4 was 0.9 ( 0.8-1.8)  TSH     Status: Abnormal   Collection Time: 09/16/17  3:19 PM  Result Value Ref Range   TSH 0.34 (L) 0.40 - 4.50 mIU/L  T4, free     Status: None   Collection Time: 09/16/17  3:19 PM  Result Value Ref Range   Free T4 1.2 0.8 - 1.8 ng/dL  T3, free     Status: None   Collection Time: 09/16/17  3:19 PM  Result Value Ref Range   T3, Free 3.6 2.3 - 4.2 pg/mL  Thyroid peroxidase antibody     Status: None   Collection Time:  09/16/17  3:19 PM  Result Value Ref Range   Thyroperoxidase Ab SerPl-aCnc <1 <9 IU/mL  Thyroglobulin  antibody     Status: None   Collection Time: 09/16/17  3:19 PM  Result Value Ref Range   Thyroglobulin Ab <1 < or = 1 IU/mL    August 06, 2017 labs: Free T4 0.9 (normal 0.8-1.8), TSH 0.21 (normal 0.4-4.5)   Assessment & Plan:   1. Subclinical hyperthyroidism 2.  Chronic heavy smoking -His repeat labs are still consistent with subclinical hyperthyroidism.  He has subtle nonspecific symptoms. He will not require ablative antithyroid therapy, however may benefit from low-dose Tapazole.  I discussed and initiated Tapazole 5 mg p.o. daily with plan to repeat thyroid function test in 3 months. -He is extensively counseled against smoking x 15 minutes - I advised patient to maintain close follow up with Erasmo Downer, NP for primary care needs.   Follow up plan: Return in about 3 months (around 12/26/2017) for follow up with pre-visit labs.   Marquis Lunch, MD [facility] [address] [place], [place] [address] Phone: [phone]  Fax: [phone]     09/25/2017, 6:07 PM  This note was partially dictated with voice recognition software. Similar sounding words can be transcribed inadequately or may not  be corrected upon review.

## 2017-12-05 ENCOUNTER — Other Ambulatory Visit: Payer: Self-pay

## 2017-12-05 MED ORDER — METHIMAZOLE 5 MG PO TABS: 5.0000 mg | ORAL_TABLET | Freq: Every day | ORAL | 3 refills | Status: DC

## 2018-01-13 ENCOUNTER — Ambulatory Visit: Payer: BLUE CROSS/BLUE SHIELD | Admitting: "Endocrinology

## 2018-01-14 LAB — TSH: TSH: 1.39 m[IU]/L (ref 0.40–4.50)

## 2018-01-14 LAB — T3, FREE: T3, Free: 3.6 pg/mL (ref 2.3–4.2)

## 2018-01-14 LAB — T4, FREE: Free T4: 0.8 ng/dL (ref 0.8–1.8)

## 2018-01-20 ENCOUNTER — Emergency Department (HOSPITAL_COMMUNITY)
Admission: EM | Admit: 2018-01-20 | Discharge: 2018-01-20 | Disposition: A | Discharge status: Home / Self Care | Payer: BLUE CROSS/BLUE SHIELD | Attending: Emergency Medicine | Admitting: Emergency Medicine

## 2018-01-20 ENCOUNTER — Encounter (HOSPITAL_COMMUNITY): Payer: Self-pay | Admitting: Emergency Medicine

## 2018-01-20 ENCOUNTER — Emergency Department (HOSPITAL_COMMUNITY): Payer: BLUE CROSS/BLUE SHIELD

## 2018-01-20 ENCOUNTER — Other Ambulatory Visit: Payer: Self-pay

## 2018-01-20 DIAGNOSIS — R0789 Other chest pain: Secondary | ICD-10-CM | POA: Insufficient documentation

## 2018-01-20 DIAGNOSIS — F17210 Nicotine dependence, cigarettes, uncomplicated: Secondary | ICD-10-CM | POA: Insufficient documentation

## 2018-01-20 DIAGNOSIS — R0781 Pleurodynia: Secondary | ICD-10-CM

## 2018-01-20 DIAGNOSIS — R079 Chest pain, unspecified: Secondary | ICD-10-CM | POA: Diagnosis present

## 2018-01-20 LAB — BASIC METABOLIC PANEL
ANION GAP: 8 (ref 5–15)
BUN: 7 mg/dL (ref 6–20)
CHLORIDE: 101 mmol/L (ref 98–111)
CO2: 29 mmol/L (ref 22–32)
Calcium: 8.9 mg/dL (ref 8.9–10.3)
Creatinine, Ser: 0.64 mg/dL (ref 0.61–1.24)
GFR calc non Af Amer: >60 mL/min (ref >60)
Glucose, Bld: 103 mg/dL — ABNORMAL HIGH (ref 70–99)
POTASSIUM: 4 mmol/L (ref 3.5–5.1)
SODIUM: 138 mmol/L (ref 135–145)

## 2018-01-20 LAB — I-STAT TROPONIN, ED: Troponin i, poc: 0 ng/mL (ref 0.00–0.08)

## 2018-01-20 LAB — CBC
HCT: 41.1 % (ref 39.0–52.0)
HEMOGLOBIN: 13.4 g/dL (ref 13.0–17.0)
MCH: 28.4 pg (ref 26.0–34.0)
MCHC: 32.6 g/dL (ref 30.0–36.0)
MCV: 87.1 fL (ref 78.0–100.0)
Platelets: 222 10*3/uL (ref 150–400)
RBC: 4.72 MIL/uL (ref 4.22–5.81)
RDW: 12.8 % (ref 11.5–15.5)
WBC: 12.2 10*3/uL — AB (ref 4.0–10.5)

## 2018-01-20 LAB — D-DIMER, QUANTITATIVE (NOT AT ARMC)

## 2018-01-20 MED ORDER — MORPHINE SULFATE (PF) 4 MG/ML IV SOLN
4.0000 mg | Freq: Once | INTRAVENOUS | Status: AC
  Administered 2018-01-20: 4 mg via INTRAVENOUS
  Filled 2018-01-20: qty 1

## 2018-01-20 MED ORDER — KETOROLAC TROMETHAMINE 30 MG/ML IJ SOLN
30.0000 mg | Freq: Once | INTRAMUSCULAR | Status: AC
  Administered 2018-01-20: 30 mg via INTRAVENOUS
  Filled 2018-01-20: qty 1

## 2018-01-20 MED ORDER — OXYCODONE-ACETAMINOPHEN 5-325 MG PO TABS: 1.0000 | ORAL_TABLET | ORAL | 0 refills | Status: DC | PRN

## 2018-01-20 MED ORDER — NAPROXEN 500 MG PO TABS: 500.0000 mg | ORAL_TABLET | Freq: Two times a day (BID) | ORAL | 0 refills | Status: DC

## 2018-01-20 MED ORDER — ASPIRIN 81 MG PO CHEW
324.0000 mg | CHEWABLE_TABLET | Freq: Once | ORAL | Status: AC
  Administered 2018-01-20: 324 mg via ORAL
  Filled 2018-01-20: qty 4

## 2018-01-20 MED ORDER — DEXAMETHASONE 4 MG PO TABS
10.0000 mg | ORAL_TABLET | Freq: Once | ORAL | Status: AC
  Administered 2018-01-20: 10 mg via ORAL
  Filled 2018-01-20: qty 3

## 2018-01-20 NOTE — Discharge Instructions (Addendum)
Return if symptoms are getting worse. °

## 2018-01-20 NOTE — ED Triage Notes (Signed)
Pt with c/o chest pain that started yesterday around 3pm when doing yard work. Pt states it is worse with breathing.

## 2018-01-20 NOTE — ED Provider Notes (Signed)
[facility] EMERGENCY DEPARTMENT Provider Note   CSN: [id number] Arrival date & time: 01/20/18  4098     History   Chief Complaint Chief Complaint  Patient presents with  . Chest Pain    HPI Ricardo Knox is a 32 y.o. male.  The history is provided by the patient.  He has history of chronic back pain and comes in complaining of mid chest pain or onset this evening.  Pain is sharp and worse with deep breath and worse with movement.  He states he is short of breath because it hurts to take a breath.  He denies cough.  He denies nausea, vomiting, diaphoresis.  He has not had anything to treat his pain.  He denies any recent travel and he denies any recent surgery.  He denies history of DVT or pulmonary embolism.  There is family history of coronary artery disease in grandparents, but not in first-degree relatives.  Past Medical History:  Diagnosis Date  . Allergy   . Chronic back pain greater than 3 months duration   . History of herniorrhaphy   . Tobacco use     Patient Active Problem List   Diagnosis Date Noted  . Current smoker 09/25/2017  . Subclinical hyperthyroidism 09/16/2017  . Long term prescription opiate use 05/12/2015  . Encounter for chronic pain management 05/12/2015  . Opioid dependence, daily use (HCC) 05/12/2015  . Neuropathic pain 04/13/2015  . Neurogenic pain 04/13/2015  . Chronic lower extremity pain (Location of Secondary source of pain) (Left) 04/13/2015  . Chronic knee pain (Left) 04/13/2015  . Musculoskeletal pain 04/13/2015  . Lumbar spondylosis (L3-4 DDD by 2014 MRI) 03/14/2015  . Encounter for therapeutic drug level monitoring 03/14/2015  . Long term current use of opiate analgesic 03/14/2015  . Uncomplicated opioid dependence (HCC) 03/14/2015  . Opiate use 03/14/2015  . Lumbar facet syndrome (Location of Primary Source of Pain) (Bilateral) (L>R) 03/14/2015  . Chronic low back pain (Location of Primary Source of Pain) (Left) 03/14/2015  .  Chronic pain 03/14/2015  . Cervical facet syndrome 03/14/2015  . Vitamin D deficiency 03/14/2015  . Cervical spondylosis 03/14/2015    Class: Chronic    Past Surgical History:  Procedure Laterality Date  . APPENDECTOMY    . HERNIA REPAIR          Home Medications    Prior to Admission medications   Medication Sig Start Date End Date Taking? Authorizing Provider  buPROPion (WELLBUTRIN XL) 150 MG 24 hr tablet every morning. 08/12/17  Yes [provider]  methimazole (TAPAZOLE) 5 MG tablet Take 1 tablet (5 mg total) by mouth daily. 12/05/17  Yes Nida, Denman George, MD  tizanidine (ZANAFLEX) 2 MG capsule Take by mouth once.   Yes [provider]  venlafaxine XR (EFFEXOR-XR) 75 MG 24 hr capsule every morning. 08/12/17  Yes [provider]    Family History Family History  Problem Relation Age of Onset  . Kidney cancer Father   . High Cholesterol Father   . Hypertension Father   . Kidney disease Father     Social History Social History   Tobacco Use  . Smoking status: Current Every Day Smoker    Packs/day: 1.50    Years: 10.00    Pack years: 15.00    Types: Cigarettes  . Smokeless tobacco: Never Used  Substance Use Topics  . Alcohol use: No    Alcohol/week: 0.0 standard drinks  . Drug use: No  Allergies   Hydrocodone   Review of Systems Review of Systems  All other systems reviewed and are negative.    Physical Exam Updated Vital Signs BP (!) 133/96 (BP Location: Left Arm)   Pulse 95   Temp 98.9 F (37.2 C) (Oral)   Resp 17   Ht 6' (1.829 m)   Wt 79.4 kg   SpO2 95%   BMI 23.73 kg/m   Physical Exam   ED Treatments / Results  Labs (all labs ordered are listed, but only abnormal results are displayed) Labs Reviewed  BASIC METABOLIC PANEL - Abnormal; Notable for the following components:      Result Value   Glucose, Bld 103 (*)    All other components within normal limits  CBC - Abnormal; Notable for the  following components:   WBC 12.2 (*)    All other components within normal limits  D-DIMER, QUANTITATIVE (NOT AT [facility] [address])  I-STAT TROPONIN, ED    EKG EKG Interpretation  Date/Time:  Monday January 20 2018 03:20:20 EDT Ventricular Rate:  91 PR Interval:    QRS Duration: 98 QT Interval:  342 QTC Calculation: 421 R Axis:   52 Text Interpretation:  Sinus rhythm ST elev, probable normal early repol pattern No old tracing to compare Confirmed by Dione Booze ([id number]) on 01/20/2018 3:31:17 AM   Radiology Dg Chest 2 View  Result Date: 01/20/2018 CLINICAL DATA:  32 year old male with chest pain. EXAM: CHEST - 2 VIEW COMPARISON:  None. FINDINGS: The heart size and mediastinal contours are within normal limits. Both lungs are clear. The visualized skeletal structures are unremarkable. IMPRESSION: No active cardiopulmonary disease. Electronically Signed   By: Elgie Collard M.D.   On: 01/20/2018 04:15    Procedures Procedures   Medications Ordered in ED Medications  morphine 4 MG/ML injection 4 mg (has no administration in time range)  dexamethasone (DECADRON) tablet 10 mg (has no administration in time range)  ketorolac (TORADOL) 30 MG/ML injection 30 mg (30 mg Intravenous Given 01/20/18 0433)  aspirin chewable tablet 324 mg (324 mg Oral Given 01/20/18 0433)  morphine 4 MG/ML injection 4 mg (4 mg Intravenous Given 01/20/18 0533)     Initial Impression / Assessment and Plan / ED Course  I have reviewed the triage vital signs and the nursing notes.  Pertinent labs & imaging results that were available during my care of the patient were reviewed by me and considered in my medical decision making (see chart for details).  Chest wall pain.  No red flags to suggest more serious pathology.  Heart score is 1, which puts him at low risk for major adverse cardiac events in the next 6 weeks.  Pulmonary embolism is ruled out by Anner Crete and PERC criteria.  However, hyperthyroidism is an independent risk  factor for venous thromboembolism, so will screen with d-dimer.  ECG shows early repolarization changes and chest x-ray is normal.  Troponin is normal.  He will be given a dose of aspirin and ketorolac.  Old records are reviewed, and he has no relevant past visits, currently seeing an endocrinologist for subclinical hyperthyroidism.  D-dimer has come back normal.  In spite of ketorolac and morphine, patient continues to complain of pleuritic chest pain and feeling like he is not breathing well.  In spite of this, oxygen saturation remains at 100%.  He is given reassurance of benign work-up and is discharged with prescription for naproxen and a small number of oxycodone-acetaminophen tablets.  Given a dose of dexamethasone  prior to discharge.  Return precautions discussed.  Final Clinical Impressions(s) / ED Diagnoses   Final diagnoses:  Pleuritic chest pain    ED Discharge Orders         Ordered    naproxen (NAPROSYN) 500 MG tablet  2 times daily     01/20/18 0607    oxyCODONE-acetaminophen (PERCOCET) 5-325 MG tablet  Every 4 hours PRN     01/20/18 [id number]           Dione BoozeGlick, Nekia Maxham, MD 01/20/18 [phone]

## 2018-01-30 ENCOUNTER — Ambulatory Visit (INDEPENDENT_AMBULATORY_CARE_PROVIDER_SITE_OTHER): Payer: BLUE CROSS/BLUE SHIELD | Admitting: "Endocrinology

## 2018-01-30 ENCOUNTER — Encounter: Payer: Self-pay | Admitting: "Endocrinology

## 2018-01-30 VITALS — BP 135/81 | HR 85 | Ht 71.0 in | Wt 176.0 lb

## 2018-01-30 DIAGNOSIS — E0590 Thyrotoxicosis, unspecified without thyrotoxic crisis or storm: Secondary | ICD-10-CM | POA: Diagnosis not present

## 2018-01-30 MED ORDER — METHIMAZOLE 5 MG PO TABS: 2.5000 mg | ORAL_TABLET | Freq: Every day | ORAL | 2 refills | Status: DC

## 2018-01-30 NOTE — Progress Notes (Signed)
Endocrinology follow-up note                                            01/30/2018, 3:11 PM   Subjective:    Patient ID: Ricardo Knox, male    DOB: 07-Jul-1985, PCP Renee Rival, NP   Past Medical History:  Diagnosis Date  . Allergy   . Chronic back pain greater than 3 months duration   . History of herniorrhaphy   . Tobacco use    Past Surgical History:  Procedure Laterality Date  . APPENDECTOMY    . HERNIA REPAIR     Social History   Socioeconomic History  . Marital status: Single    Spouse name: Not on file  . Number of children: Not on file  . Years of education: Not on file  . Highest education level: Not on file  Occupational History  . Not on file  Social Needs  . Financial resource strain: Not on file  . Food insecurity:    Worry: Not on file    Inability: Not on file  . Transportation needs:    Medical: Not on file    Non-medical: Not on file  Tobacco Use  . Smoking status: Current Every Day Smoker    Packs/day: 1.50    Years: 10.00    Pack years: 15.00    Types: Cigarettes  . Smokeless tobacco: Never Used  Substance and Sexual Activity  . Alcohol use: No    Alcohol/week: 0.0 standard drinks  . Drug use: No  . Sexual activity: Not on file  Lifestyle  . Physical activity:    Days per week: Not on file    Minutes per session: Not on file  . Stress: Not on file  Relationships  . Social connections:    Talks on phone: Not on file    Gets together: Not on file    Attends religious service: Not on file    Active member of club or organization: Not on file    Attends meetings of clubs or organizations: Not on file    Relationship status: Not on file  Other Topics Concern  . Not on file  Social History Narrative  . Not on file   Outpatient Encounter Medications as of 01/30/2018  Medication Sig  . buPROPion (WELLBUTRIN XL) 150 MG 24 hr tablet every morning.  . methimazole (TAPAZOLE) 5 MG tablet Take 0.5 tablets (2.5 mg total) by  mouth daily.  . naproxen (NAPROSYN) 500 MG tablet Take 1 tablet (500 mg total) by mouth 2 (two) times daily.  Marland Kitchen oxyCODONE-acetaminophen (PERCOCET) 5-325 MG tablet Take 1 tablet by mouth every 4 (four) hours as needed for moderate pain.  . tizanidine (ZANAFLEX) 2 MG capsule Take by mouth once.  . venlafaxine XR (EFFEXOR-XR) 75 MG 24 hr capsule every morning.  . [DISCONTINUED] methimazole (TAPAZOLE) 5 MG tablet Take 1 tablet (5 mg total) by mouth daily.   No facility-administered encounter medications on file as of 01/30/2018.    ALLERGIES: Allergies  Allergen Reactions  . Hydrocodone     VACCINATION STATUS:  There is no immunization history on file for this patient.  HPI Ricardo Knox is 32 y.o. male who presents today with a medical history as above. he is returning with repeat thyroid function tests after he was initiated on low-dose  Tapazole for treatment of subclinical hyperthyroidism.    -He reports provement in his symptoms of anxiety, intermittent palpitations, sweating/heat intolerance.   -He has no new complaints today.    -He has a steady weight since last visit.    -  He reports some unidentified thyroid dysfunction in 1 of his aunts.  He denies dysphagia, shortness of breath, change in voice.  He denies heat/cold intolerance.  He continues to smoke heavily.  Review of Systems  Constitutional: + Steady weight,  + fatigue, - subjective hyperthermia, no subjective hypothermia Eyes: no blurry vision, no xerophthalmia ENT: no sore throat, no nodules palpated in throat, no dysphagia/odynophagia, no hoarseness Cardiovascular: no Chest Pain, no Shortness of Breath, no palpitations, no leg swelling Respiratory: no cough, no SOB Gastrointestinal: no Nausea/Vomiting/Diarhhea Musculoskeletal: no muscle/joint aches Skin: no rashes Neurological: + tremors, no numbness, no tingling, no dizziness Psychiatric: no depression, + anxiety  Objective:    BP 135/81   Pulse 85    Ht 5' 11"  (1.803 m)   Wt 176 lb (79.8 kg)   BMI 24.55 kg/m   Wt Readings from Last 3 Encounters:  01/30/18 176 lb (79.8 kg)  01/20/18 175 lb (79.4 kg)  09/25/17 185 lb (83.9 kg)    Physical Exam  Constitutional: + Appropriate weight for height, + slightly anxious   Not in acute distress. Eyes: PERRLA, EOMI, no exophthalmos ENT: moist mucous membranes, no thyromegaly, no cervical lymphadenopathy CVS: No tachycardia Musculoskeletal: no gross deformities, strength intact in all four extremities Skin: moist, warm, no rashes Neurological: + tremor with outstretched hands  CMP ( most recent) CMP     Component Value Date/Time   NA 138 01/20/2018 0322   NA 143 01/20/2014 1642   K 4.0 01/20/2018 0322   K 4.1 01/20/2014 1642   CL 101 01/20/2018 0322   CL 108 (H) 01/20/2014 1642   CO2 29 01/20/2018 0322   CO2 25 01/20/2014 1642   GLUCOSE 103 (H) 01/20/2018 0322   GLUCOSE 94 01/20/2014 1642   BUN 7 01/20/2018 0322   BUN 10 01/20/2014 1642   CREATININE 0.64 01/20/2018 0322   CREATININE 0.79 01/20/2014 1642   CALCIUM 8.9 01/20/2018 0322   CALCIUM 9.3 01/20/2014 1642   PROT 7.7 07/11/2015 1058   PROT 8.0 01/20/2014 1642   ALBUMIN 4.6 07/11/2015 1058   ALBUMIN 4.5 01/20/2014 1642   AST 28 07/11/2015 1058   AST 20 01/20/2014 1642   ALT 48 07/11/2015 1058   ALT 32 01/20/2014 1642   ALKPHOS 95 07/11/2015 1058   ALKPHOS 76 01/20/2014 1642   BILITOT 0.7 07/11/2015 1058   BILITOT 0.5 01/20/2014 1642   GFRNONAA >60 01/20/2018 0322   GFRNONAA >60 01/20/2014 1642   GFRAA >60 01/20/2018 0322   GFRAA >60 01/20/2014 1642   Recent Results (from the past 2160 hour(s))  TSH     Status: None   Collection Time: 01/13/18  2:45 PM  Result Value Ref Range   TSH 1.39 0.40 - 4.50 mIU/L  T4, free     Status: None   Collection Time: 01/13/18  2:45 PM  Result Value Ref Range   Free T4 0.8 0.8 - 1.8 ng/dL  T3, free     Status: None   Collection Time: 01/13/18  2:45 PM  Result Value Ref Range    T3, Free 3.6 2.3 - 4.2 pg/mL  Basic metabolic panel     Status: Abnormal   Collection Time: 01/20/18  3:22 AM  Result  Value Ref Range   Sodium 138 135 - 145 mmol/L   Potassium 4.0 3.5 - 5.1 mmol/L   Chloride 101 98 - 111 mmol/L   CO2 29 22 - 32 mmol/L   Glucose, Bld 103 (H) 70 - 99 mg/dL   BUN 7 6 - 20 mg/dL   Creatinine, Ser 0.64 0.61 - 1.24 mg/dL   Calcium 8.9 8.9 - 10.3 mg/dL   GFR calc non Af Amer >60 >60 mL/min   GFR calc Af Amer >60 >60 mL/min    Comment: (NOTE) The eGFR has been calculated using the CKD EPI equation. This calculation has not been validated in all clinical situations. eGFR's persistently <60 mL/min signify possible Chronic Kidney Disease.    Anion gap 8 5 - 15    Comment: Performed at [facility], [address] [place], [place] [address]  CBC     Status: Abnormal   Collection Time: 01/20/18  3:22 AM  Result Value Ref Range   WBC 12.2 (H) 4.0 - 10.5 K/uL   RBC 4.72 4.22 - 5.81 MIL/uL   Hemoglobin 13.4 13.0 - 17.0 g/dL   HCT 41.1 39.0 - 52.0 %   MCV 87.1 78.0 - 100.0 fL   MCH 28.4 26.0 - 34.0 pg   MCHC 32.6 30.0 - 36.0 g/dL   RDW 12.8 11.5 - 15.5 %   Platelets 222 150 - 400 K/uL    Comment: Performed at [facility], [address], [place], [place] [address]  D-dimer, quantitative     Status: None   Collection Time: 01/20/18  3:22 AM  Result Value Ref Range   D-Dimer, Quant <0.27 0.00 - 0.50 ug/mL-FEU    Comment: (NOTE) At the manufacturer cut-off of 0.50 ug/mL FEU, this assay has been documented to exclude PE with a sensitivity and negative predictive value of 97 to 99%.  At this time, this assay has not been approved by the FDA to exclude DVT/VTE. Results should be correlated with clinical presentation. Performed at [facility], [address] [place], [place] [address]   I-stat troponin, ED     Status: None   Collection Time: 01/20/18  3:30 AM  Result Value Ref Range   Troponin i, poc 0.00 0.00 - 0.08 ng/mL   Comment 3             Comment: Due to the release kinetics of cTnI, a negative result within the first hours of the onset of symptoms does not rule out myocardial infarction with certainty. If myocardial infarction is still suspected, repeat the test at appropriate intervals.     August 06, 2017 labs: Free T4 0.9 (normal 0.8-1.8), TSH 0.21 (normal 0.4-4.5)   Assessment & Plan:   1. Subclinical hyperthyroidism 2.  Chronic heavy smoking -His repeat labs are consistent with improvement in his subclinical hyperthyroidism.  He still has subtle nonspecific symptoms, but out of proportion for the thyroid dysfunction he has. I will proceed to lower his Tapazole to 2.5 mg p.o. daily with plan to repeat thyroid function test in 3 months. -He may have undiagnosed panic attack/generalized anxiety disorder.  -I will add a.m. cortisol during his next lab work. -He is extensively counseled against smoking x 15 minutes - I advised patient to maintain close follow up with Renee Rival, NP for primary care needs.   Follow up plan: Return in about 3 months (around 05/01/2018) for Follow up with Pre-visit Labs.   Glade Lloyd, MD [facility] [address] [place], [place] [address] Phone: [phone]  Fax: [phone]     01/30/2018, 3:11 PM  This note was partially dictated with voice recognition software. Similar sounding words can be transcribed inadequately or may not  be corrected upon review.

## 2018-04-04 ENCOUNTER — Other Ambulatory Visit: Payer: Self-pay | Admitting: "Endocrinology

## 2018-05-01 ENCOUNTER — Ambulatory Visit: Payer: BLUE CROSS/BLUE SHIELD | Admitting: "Endocrinology

## 2018-05-02 LAB — T4, FREE: Free T4: 1 ng/dL (ref 0.8–1.8)

## 2018-05-02 LAB — CORTISOL-AM, BLOOD: CORTISOL - AM: 9.5 ug/dL

## 2018-05-02 LAB — TSH: TSH: 0.78 mIU/L (ref 0.40–4.50)

## 2018-06-14 ENCOUNTER — Other Ambulatory Visit: Payer: Self-pay | Admitting: "Endocrinology

## 2018-10-04 ENCOUNTER — Other Ambulatory Visit: Payer: Self-pay | Admitting: "Endocrinology

## 2019-12-21 IMAGING — DX DG CHEST 2V
2 series · 2 of 2 positions shown · non-contrast
Comparison: None.

CLINICAL DATA: 32-year-old male with chest pain.

EXAM:
CHEST - 2 VIEW

[chest pa]
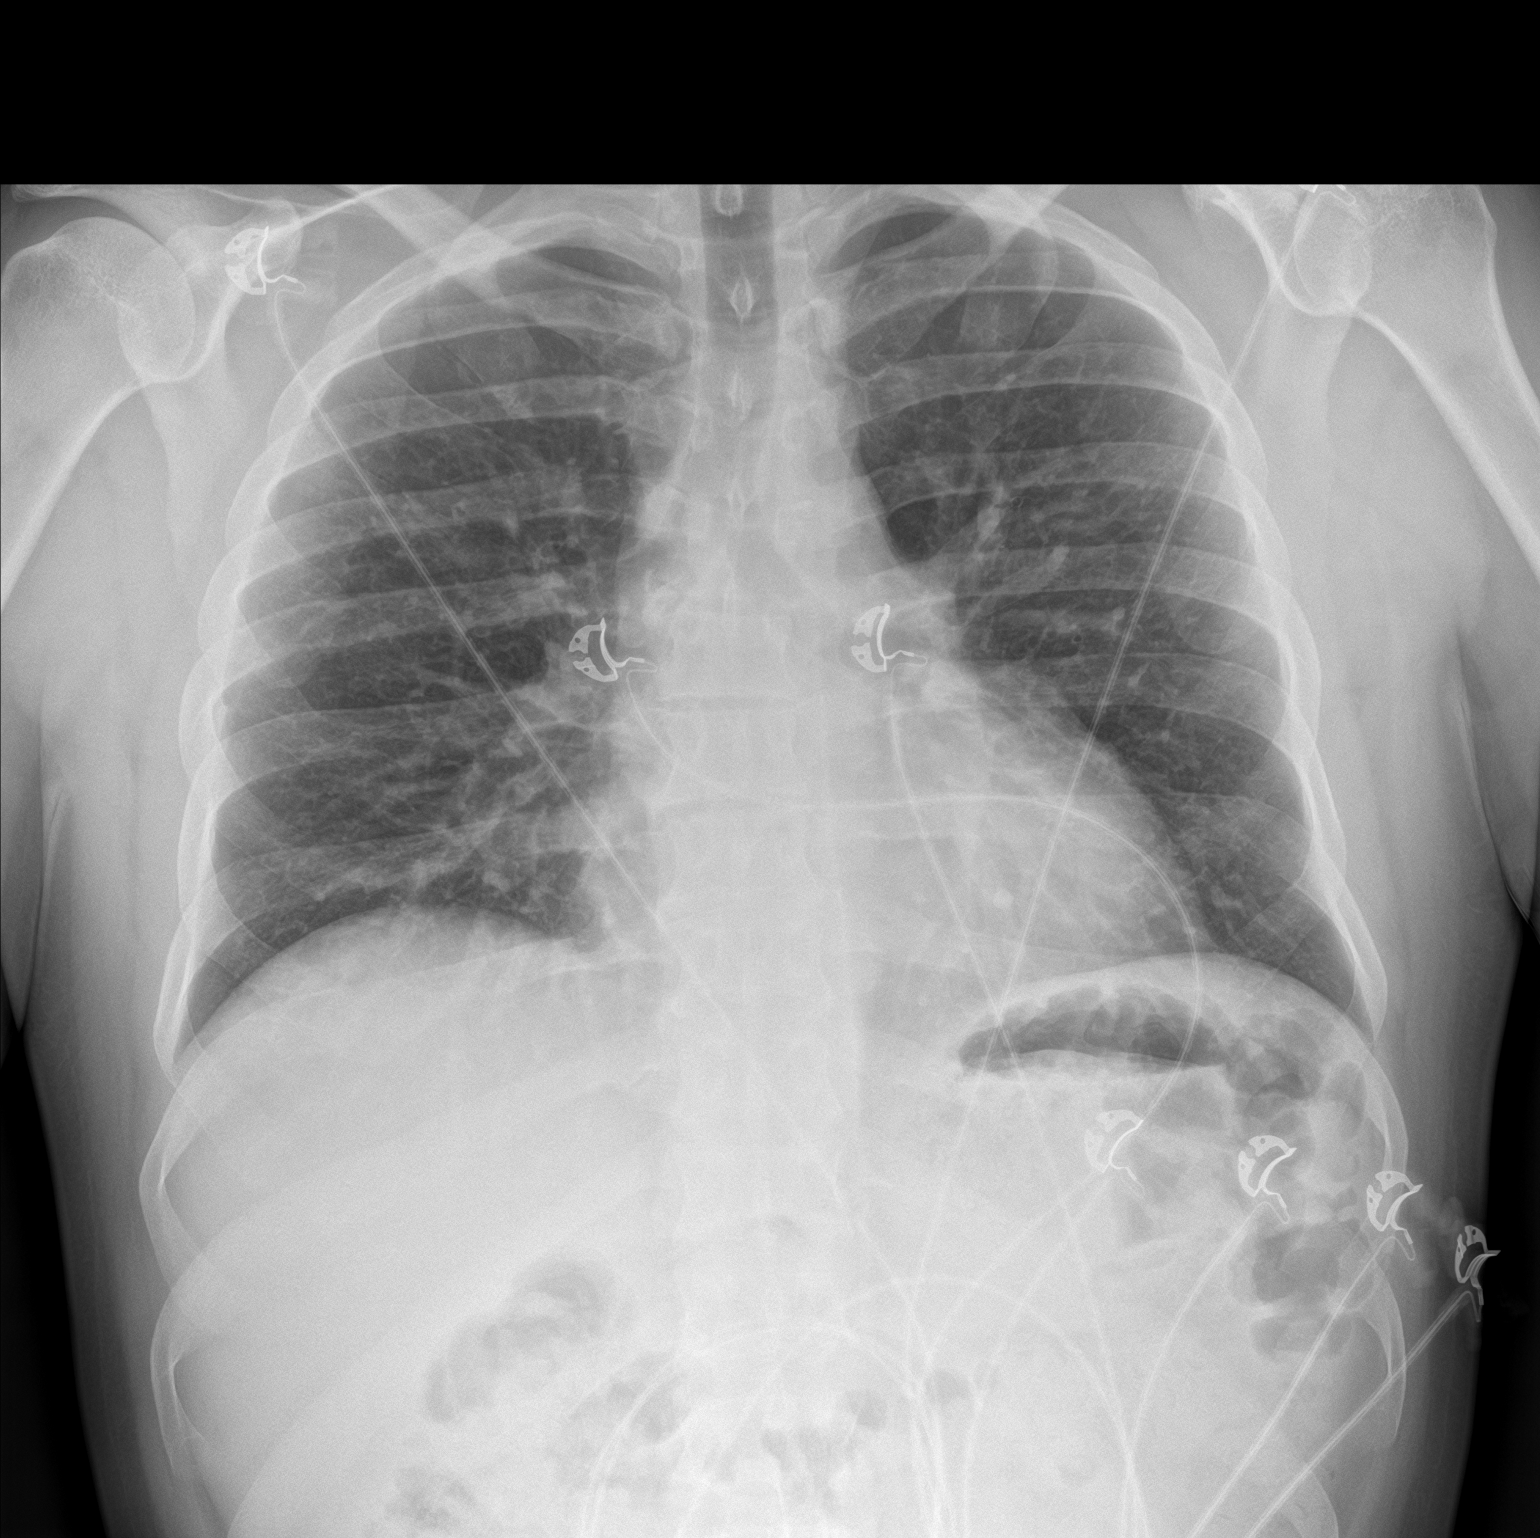

[chest lat]
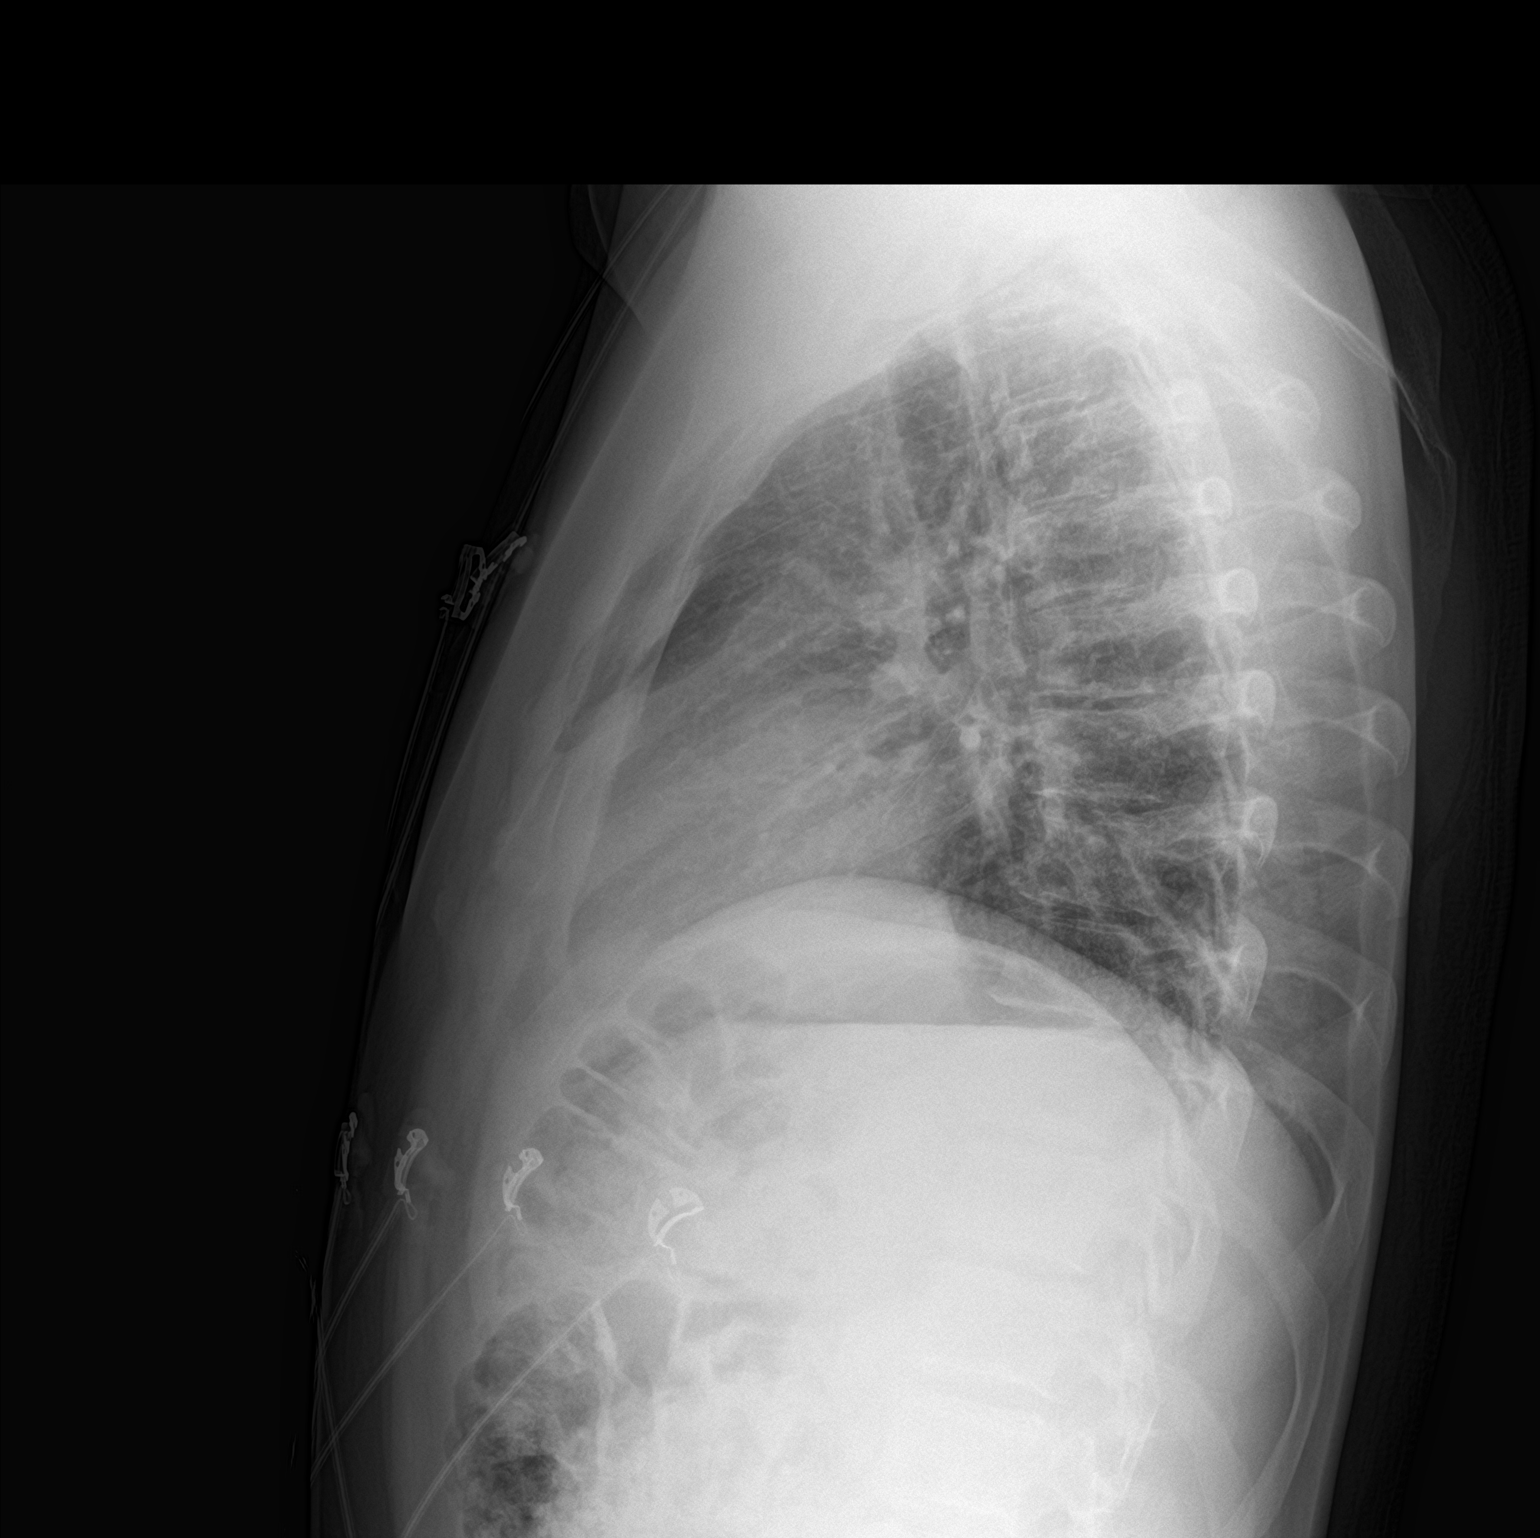

[2 of 2 positions shown; findings below may reference images not displayed]

FINDINGS: The heart size and mediastinal contours are within normal limits.
Both lungs are clear. The visualized skeletal structures are
unremarkable.
IMPRESSION: No active cardiopulmonary disease.

## 2020-01-06 ENCOUNTER — Other Ambulatory Visit: Payer: Self-pay

## 2020-01-06 DIAGNOSIS — R112 Nausea with vomiting, unspecified: Secondary | ICD-10-CM | POA: Insufficient documentation

## 2020-01-06 DIAGNOSIS — R109 Unspecified abdominal pain: Secondary | ICD-10-CM | POA: Insufficient documentation

## 2020-01-06 DIAGNOSIS — F17210 Nicotine dependence, cigarettes, uncomplicated: Secondary | ICD-10-CM | POA: Insufficient documentation

## 2020-01-07 ENCOUNTER — Other Ambulatory Visit: Payer: Self-pay

## 2020-01-07 ENCOUNTER — Encounter (HOSPITAL_COMMUNITY): Payer: Self-pay | Admitting: Emergency Medicine

## 2020-01-07 ENCOUNTER — Emergency Department (HOSPITAL_COMMUNITY)
Admission: EM | Admit: 2020-01-07 | Discharge: 2020-01-07 | Disposition: A | Discharge status: Home / Self Care | Payer: Self-pay | Attending: Emergency Medicine | Admitting: Emergency Medicine

## 2020-01-07 DIAGNOSIS — R112 Nausea with vomiting, unspecified: Secondary | ICD-10-CM

## 2020-01-07 LAB — CBC
HCT: 47.5 % (ref 39.0–52.0)
Hemoglobin: 15.5 g/dL (ref 13.0–17.0)
MCH: 27.9 pg (ref 26.0–34.0)
MCHC: 32.6 g/dL (ref 30.0–36.0)
MCV: 85.6 fL (ref 80.0–100.0)
Platelets: 294 10*3/uL (ref 150–400)
RBC: 5.55 MIL/uL (ref 4.22–5.81)
RDW: 13.2 % (ref 11.5–15.5)
WBC: 12.1 10*3/uL — ABNORMAL HIGH (ref 4.0–10.5)
nRBC: 0 % (ref 0.0–0.2)

## 2020-01-07 LAB — COMPREHENSIVE METABOLIC PANEL
ALT: 42 U/L (ref 0–44)
AST: 21 U/L (ref 15–41)
Albumin: 4.8 g/dL (ref 3.5–5.0)
Alkaline Phosphatase: 70 U/L (ref 38–126)
Anion gap: 15 (ref 5–15)
BUN: 19 mg/dL (ref 6–20)
CO2: 28 mmol/L (ref 22–32)
Calcium: 9.9 mg/dL (ref 8.9–10.3)
Chloride: 93 mmol/L — ABNORMAL LOW (ref 98–111)
Creatinine, Ser: 1.17 mg/dL (ref 0.61–1.24)
GFR calc Af Amer: >60 mL/min (ref >60)
GFR calc non Af Amer: >60 mL/min (ref >60)
Glucose, Bld: 117 mg/dL — ABNORMAL HIGH (ref 70–99)
Potassium: 4.4 mmol/L (ref 3.5–5.1)
Sodium: 136 mmol/L (ref 135–145)
Total Bilirubin: 0.4 mg/dL (ref 0.3–1.2)
Total Protein: 8.5 g/dL — ABNORMAL HIGH (ref 6.5–8.1)

## 2020-01-07 LAB — LIPASE, BLOOD: Lipase: 23 U/L (ref 11–51)

## 2020-01-07 MED ORDER — ONDANSETRON HCL 4 MG PO TABS
4.0000 mg | ORAL_TABLET | Freq: Four times a day (QID) | ORAL | 0 refills | Status: DC | PRN
Start: 2020-01-07 — End: 2021-11-21

## 2020-01-07 MED ORDER — ONDANSETRON HCL 4 MG/2ML IJ SOLN
4.0000 mg | Freq: Once | INTRAMUSCULAR | Status: AC
  Administered 2020-01-07: 4 mg via INTRAVENOUS
  Filled 2020-01-07: qty 2

## 2020-01-07 MED ORDER — SODIUM CHLORIDE 0.9 % IV BOLUS
1000.0000 mL | Freq: Once | INTRAVENOUS | Status: AC
  Administered 2020-01-07: 1000 mL via INTRAVENOUS

## 2020-01-07 MED ORDER — ONDANSETRON HCL 4 MG PO TABS
4.0000 mg | ORAL_TABLET | Freq: Three times a day (TID) | ORAL | 0 refills | Status: DC | PRN
Start: 2020-01-07 — End: 2021-11-21

## 2020-01-07 NOTE — ED Notes (Signed)
No vomiting since moved to tx room

## 2020-01-07 NOTE — ED Notes (Signed)
Pt asleep.

## 2020-01-07 NOTE — Discharge Instructions (Signed)
Return if you are having any problems. 

## 2020-01-07 NOTE — ED Notes (Signed)
Pt tolerated fluids well.  

## 2020-01-07 NOTE — ED Provider Notes (Signed)
[facility] EMERGENCY DEPARTMENT Provider Note   CSN: [id number] Arrival date & time: 01/06/20  2207   History Chief Complaint  Patient presents with  . Emesis    Ricardo Knox is a 34 y.o. male.  The history is provided by the patient.  Emesis He has history of chronic back pain and comes in because of vomiting.  He had been working outside in hot, humid conditions and had been drinking fluids to try to stay hydrated.  He started getting sick and vomited and vomited multiple times.  Every time and he tried to take a few sips, he would throw up.  He thinks he has thrown up greater than 10 times.  There has been some abdominal cramping.  He denies diarrhea.  Denies any sick contacts.  He denies fever chills.  He was sweating because he was outside in hot and humid conditions.  Past Medical History:  Diagnosis Date  . Allergy   . Chronic back pain greater than 3 months duration   . History of herniorrhaphy   . Tobacco use     Patient Active Problem List   Diagnosis Date Noted  . Current smoker 09/25/2017  . Subclinical hyperthyroidism 09/16/2017  . Long term prescription opiate use 05/12/2015  . Encounter for chronic pain management 05/12/2015  . Opioid dependence, daily use (HCC) 05/12/2015  . Neuropathic pain 04/13/2015  . Neurogenic pain 04/13/2015  . Chronic lower extremity pain (Location of Secondary source of pain) (Left) 04/13/2015  . Chronic knee pain (Left) 04/13/2015  . Musculoskeletal pain 04/13/2015  . Lumbar spondylosis (L3-4 DDD by 2014 MRI) 03/14/2015  . Encounter for therapeutic drug level monitoring 03/14/2015  . Long term current use of opiate analgesic 03/14/2015  . Uncomplicated opioid dependence (HCC) 03/14/2015  . Opiate use 03/14/2015  . Lumbar facet syndrome (Location of Primary Source of Pain) (Bilateral) (L>R) 03/14/2015  . Chronic low back pain (Location of Primary Source of Pain) (Left) 03/14/2015  . Chronic pain 03/14/2015  . Cervical  facet syndrome 03/14/2015  . Vitamin D deficiency 03/14/2015  . Cervical spondylosis 03/14/2015    Class: Chronic    Past Surgical History:  Procedure Laterality Date  . APPENDECTOMY    . HERNIA REPAIR         Family History  Problem Relation Age of Onset  . Kidney cancer Father   . High Cholesterol Father   . Hypertension Father   . Kidney disease Father     Social History   Tobacco Use  . Smoking status: Current Every Day Smoker    Packs/day: 1.50    Years: 10.00    Pack years: 15.00    Types: Cigarettes  . Smokeless tobacco: Never Used  Vaping Use  . Vaping Use: Never used  Substance Use Topics  . Alcohol use: No    Alcohol/week: 0.0 standard drinks  . Drug use: No    Home Medications Prior to Admission medications   Medication Sig Start Date End Date Taking? Authorizing Provider  buPROPion (WELLBUTRIN XL) 150 MG 24 hr tablet every morning. 08/12/17   [provider]  methimazole (TAPAZOLE) 5 MG tablet TAKE (1/2) TABLET BY MOUTH ONCE DAILY. 06/16/18   Roma Kayser, MD  naproxen (NAPROSYN) 500 MG tablet Take 1 tablet (500 mg total) by mouth 2 (two) times daily. 01/20/18   Dione Booze, MD  oxyCODONE-acetaminophen (PERCOCET) 5-325 MG tablet Take 1 tablet by mouth every 4 (four) hours as needed for moderate pain. 01/20/18  Dione Booze, MD  tizanidine (ZANAFLEX) 2 MG capsule Take by mouth once.    [provider]  venlafaxine XR (EFFEXOR-XR) 75 MG 24 hr capsule every morning. 08/12/17   [provider]    Allergies    Hydrocodone  Review of Systems   Review of Systems  Gastrointestinal: Positive for vomiting.  All other systems reviewed and are negative.   Physical Exam Updated Vital Signs BP (!) 155/90   Pulse 80   Temp 98.4 F (36.9 C)   Resp 20   SpO2 100%   Physical Exam Vitals and nursing note reviewed.   34 year old male, resting comfortably and in no acute distress. Vital signs are significant for elevated  blood pressure. Oxygen saturation is 100%, which is normal. Head is normocephalic and atraumatic. PERRLA, EOMI. Oropharynx is clear. Neck is nontender and supple without adenopathy or JVD. Back is nontender and there is no CVA tenderness. Lungs are clear without rales, wheezes, or rhonchi. Chest is nontender. Heart has regular rate and rhythm without murmur. Abdomen is soft, flat, nontender without masses or hepatosplenomegaly and peristalsis is hypoactive. Extremities have no cyanosis or edema, full range of motion is present. Skin is warm and dry without rash. Neurologic: Mental status is normal, cranial nerves are intact, there are no motor or sensory deficits.  ED Results / Procedures / Treatments   Labs (all labs ordered are listed, but only abnormal results are displayed) Labs Reviewed  COMPREHENSIVE METABOLIC PANEL - Abnormal; Notable for the following components:      Result Value   Chloride 93 (*)    Glucose, Bld 117 (*)    Total Protein 8.5 (*)    All other components within normal limits  CBC - Abnormal; Notable for the following components:   WBC 12.1 (*)    All other components within normal limits  LIPASE, BLOOD  URINALYSIS, ROUTINE W REFLEX MICROSCOPIC   Procedures Procedures   Medications Ordered in ED Medications  sodium chloride 0.9 % bolus 1,000 mL (1,000 mLs Intravenous New Bag/Given (Non-Interop) 01/07/20 0404)  ondansetron (ZOFRAN) injection 4 mg (4 mg Intravenous Given 01/07/20 0404)    ED Course  I have reviewed the triage vital signs and the nursing notes.  Pertinent lab results that were available during my care of the patient were reviewed by me and considered in my medical decision making (see chart for details).   MDM Rules/Calculators/A&P Nausea and vomiting and pattern suggestive of viral gastritis.  No red flags to suggest more serious pathology.  Will check screening labs and give IV fluids and IV ondansetron.  Labs are unremarkable.   Following IV fluids, patient states he is not feeling any better.  However, nausea has resolved with ondansetron.  He will be given an oral fluid challenge.  He tolerated oral fluids well.  He is discharged with prescription for ondansetron.  Final Clinical Impression(s) / ED Diagnoses Final diagnoses:  Non-intractable vomiting with nausea, unspecified vomiting type    Rx / DC Orders ED Discharge Orders         Ordered    ondansetron (ZOFRAN) 4 MG tablet  Every 6 hours PRN     Discontinue  Reprint     01/07/20 0703           Dione Booze, MD 01/07/20 0730

## 2020-01-07 NOTE — ED Triage Notes (Signed)
Pt c/o emesis started today while at work.

## 2020-01-07 NOTE — ED Triage Notes (Signed)
Pt reports emesis that started today while at work. Denies fevers.

## 2021-11-21 ENCOUNTER — Emergency Department (HOSPITAL_COMMUNITY)
Admission: EM | Admit: 2021-11-21 | Discharge: 2021-11-21 | Disposition: A | Discharge status: Home / Self Care | Payer: Self-pay | Attending: Emergency Medicine | Admitting: Emergency Medicine

## 2021-11-21 ENCOUNTER — Encounter (HOSPITAL_COMMUNITY): Payer: Self-pay | Admitting: *Deleted

## 2021-11-21 ENCOUNTER — Other Ambulatory Visit: Payer: Self-pay

## 2021-11-21 DIAGNOSIS — J449 Chronic obstructive pulmonary disease, unspecified: Secondary | ICD-10-CM | POA: Insufficient documentation

## 2021-11-21 DIAGNOSIS — L258 Unspecified contact dermatitis due to other agents: Secondary | ICD-10-CM | POA: Insufficient documentation

## 2021-11-21 DIAGNOSIS — L03113 Cellulitis of right upper limb: Secondary | ICD-10-CM | POA: Insufficient documentation

## 2021-11-21 HISTORY — DX: Chronic obstructive pulmonary disease, unspecified: J44.9

## 2021-11-21 LAB — BASIC METABOLIC PANEL
Anion gap: 10 (ref 5–15)
BUN: 13 mg/dL (ref 6–20)
CO2: 26 mmol/L (ref 22–32)
Calcium: 9.5 mg/dL (ref 8.9–10.3)
Chloride: 104 mmol/L (ref 98–111)
Creatinine, Ser: 0.7 mg/dL (ref 0.61–1.24)
GFR, Estimated: >60 mL/min (ref >60)
Glucose, Bld: 96 mg/dL (ref 70–99)
Potassium: 4.4 mmol/L (ref 3.5–5.1)
Sodium: 140 mmol/L (ref 135–145)

## 2021-11-21 LAB — CBC
HCT: 47 % (ref 39.0–52.0)
Hemoglobin: 15.4 g/dL (ref 13.0–17.0)
MCH: 29 pg (ref 26.0–34.0)
MCHC: 32.8 g/dL (ref 30.0–36.0)
MCV: 88.5 fL (ref 80.0–100.0)
Platelets: 248 10*3/uL (ref 150–400)
RBC: 5.31 MIL/uL (ref 4.22–5.81)
RDW: 12.4 % (ref 11.5–15.5)
WBC: 13 10*3/uL — ABNORMAL HIGH (ref 4.0–10.5)
nRBC: 0 % (ref 0.0–0.2)

## 2021-11-21 MED ORDER — DIPHENHYDRAMINE HCL 50 MG/ML IJ SOLN
12.5000 mg | Freq: Once | INTRAMUSCULAR | Status: AC
  Administered 2021-11-21: 12.5 mg via INTRAVENOUS
  Filled 2021-11-21: qty 1

## 2021-11-21 MED ORDER — DIPHENHYDRAMINE HCL 25 MG PO TABS: 25.0000 mg | ORAL_TABLET | Freq: Four times a day (QID) | ORAL | 0 refills | Status: AC | PRN

## 2021-11-21 MED ORDER — CEPHALEXIN 500 MG PO CAPS: 500.0000 mg | ORAL_CAPSULE | Freq: Four times a day (QID) | ORAL | 0 refills | Status: AC

## 2021-11-21 MED ORDER — ONDANSETRON HCL 4 MG/2ML IJ SOLN
4.0000 mg | Freq: Once | INTRAMUSCULAR | Status: AC
  Administered 2021-11-21: 4 mg via INTRAVENOUS
  Filled 2021-11-21: qty 2

## 2021-11-21 MED ORDER — SODIUM CHLORIDE 0.9 % IV BOLUS
1000.0000 mL | Freq: Once | INTRAVENOUS | Status: AC
  Administered 2021-11-21: 1000 mL via INTRAVENOUS

## 2021-11-21 MED ORDER — ONDANSETRON 4 MG PO TBDP: 4.0000 mg | ORAL_TABLET | Freq: Three times a day (TID) | ORAL | 0 refills | Status: AC | PRN

## 2021-11-21 NOTE — ED Provider Notes (Signed)
[facility] EMERGENCY DEPARTMENT Provider Note   CSN: [id number] Arrival date & time: 11/21/21  1119     History  Chief Complaint  Patient presents with   Nausea   Emesis   Rash    Ricardo Knox is a 36 y.o. male with chief complaint of fevers, N/V, and rash as of this morning.  Recently got his second round of a tattoo on his right upper arm on Friday.  Was supposed to keep Saran wrap on the tattoo for 4 days, but noticed Sunday evening that it was very itchy and warm.  That continued until this morning.  Woke up and felt unwell, began with NBNB vomiting, followed by some mild epigastric tenderness.  Went to UC this morning and was provided 1 L of fluids and 1 dose of Zofran around 10 AM.  Was encouraged to come to the ED.  Denies recent illness, diarrhea, constipation, neck stiffness, hemoptysis, or significant swelling.  This is the first time he has ever received colored ink tattoos.  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  Denies alcohol use.  Denies chest pain, shortness of breath, lightheadedness, headache, dizziness.  The history is provided by the patient and medical records.  Weakness Associated symptoms: fever, nausea and vomiting        Home Medications Prior to Admission medications   Medication Sig Start Date End Date Taking? Authorizing Provider  cephALEXin (KEFLEX) 500 MG capsule Take 1 capsule (500 mg total) by mouth 4 (four) times daily for 7 days. 11/21/21 11/28/21 Yes Cecil Cobbs, PA-C  diphenhydrAMINE (BENADRYL) 25 MG tablet Take 1 tablet (25 mg total) by mouth every 6 (six) hours as needed for itching. 11/21/21  Yes Cecil Cobbs, PA-C  ondansetron (ZOFRAN-ODT) 4 MG disintegrating tablet Take 1 tablet (4 mg total) by mouth every 8 (eight) hours as needed for nausea or vomiting. 11/21/21  Yes Cecil Cobbs, PA-C  buPROPion (WELLBUTRIN XL) 150 MG 24 hr tablet every morning. 08/12/17   [provider]  methimazole (TAPAZOLE) 5 MG tablet TAKE (1/2) TABLET BY MOUTH ONCE DAILY. 06/16/18   Roma Kayser, MD  tizanidine (ZANAFLEX) 2 MG capsule Take by mouth once.    [provider]  venlafaxine XR (EFFEXOR-XR) 75 MG 24 hr capsule every morning. 08/12/17   [provider]      Allergies    Hydrocodone    Review of Systems   Review of Systems  Constitutional:  Positive for fever.  Gastrointestinal:  Positive for nausea and vomiting.  Skin:  Positive for rash.  Neurological:  Positive for weakness.    Physical Exam Updated Vital Signs BP 133/86   Pulse 64   Temp 98.6 F (37 C) (Oral)   Resp 17   Ht 6' (1.829 m)   Wt 81.6 kg   SpO2 100%   BMI 24.41 kg/m  Physical Exam Vitals and nursing note reviewed.  Constitutional:      General: He is not in acute distress.    Appearance: He is well-developed. He is not ill-appearing or diaphoretic.  HENT:     Head: Normocephalic and atraumatic.  Eyes:     Conjunctiva/sclera: Conjunctivae normal.  Cardiovascular:     Rate and Rhythm: Normal rate and regular rhythm.     Pulses:          Radial pulses are 2+ on the right side and 2+ on the left side.  Heart sounds: Normal heart sounds. No murmur heard. Pulmonary:     Effort: Pulmonary effort is normal. No respiratory distress.     Breath sounds: Normal breath sounds.  Abdominal:     General: Abdomen is flat.     Palpations: Abdomen is soft. There is no mass.     Tenderness: There is abdominal tenderness (Mild epigastric). There is no right CVA tenderness, left CVA tenderness, guarding or rebound.  Musculoskeletal:        General: No swelling.     Cervical back: Neck supple.     Right lower leg: No edema.     Left lower leg: No edema.  Skin:    General: Skin is warm and dry.     Capillary Refill: Capillary refill takes less than 2 seconds.          Comments: New tattoo with warmth and erythema as indicated above.  Extremities appear  neurovascularly intact.  Mild swelling of the area.  No active bleeding or discharge.  Neurological:     Mental Status: He is alert.  Psychiatric:        Mood and Affect: Mood normal.     ED Results / Procedures / Treatments   Labs (all labs ordered are listed, but only abnormal results are displayed) Labs Reviewed  CBC - Abnormal; Notable for the following components:      Result Value   WBC 13.0 (*)    All other components within normal limits  BASIC METABOLIC PANEL    EKG None  Radiology No results found.  Procedures Procedures    Medications Ordered in ED Medications  sodium chloride 0.9 % bolus 1,000 mL (0 mLs Intravenous Stopped 11/21/21 1653)  ondansetron (ZOFRAN) injection 4 mg (4 mg Intravenous Given 11/21/21 1435)  diphenhydrAMINE (BENADRYL) injection 12.5 mg (12.5 mg Intravenous Given 11/21/21 1435)    ED Course/ Medical Decision Making/ A&P                           Medical Decision Making Amount and/or Complexity of Data Reviewed External Data Reviewed: notes. Labs: ordered. Decision-making details documented in ED Course. Radiology: ordered and independent interpretation performed. Decision-making details documented in ED Course. ECG/medicine tests: ordered and independent interpretation performed. Decision-making details documented in ED Course.  Risk OTC drugs. Prescription drug management.   36 y.o. male presents to the ED for concern of Nausea, Emesis, and Rash   This involves an extensive number of treatment options, and is a complaint that carries with it a high risk of complications and morbidity.  The emergent differential diagnosis prior to evaluation includes, but is not limited to: Gastritis, PUD, sepsis, hyperemesis syndrome, endocarditis, cellulitis, contact dermatitis  This is not an exhaustive differential.   Past Medical History / Co-morbidities / Social History: Hx COPD, tobacco use, marijuana use.  Cessation counseling  provided.  Additional History:  Internal and external records from outside source obtained and reviewed including ED visits  Physical Exam: Physical exam performed. The pertinent findings include: Mild erythema, minimal swelling, warmth of the right upper arm within the new tattoo.  Minimal epigastric tenderness.  Lab Tests: I ordered, and personally interpreted labs.  The pertinent results include:   CBC: Mild elevated WBC 13.0, likely due to repeated vomiting CMP/BMP: Unremarkable  Imaging Studies: None  ED Course: Pt well-appearing on exam.  Presenting today with skin irritation of the right upper arm.  Clinically lacks appearance of a rash.  Also complaining of pruritus to the area.  Pt is alert, oriented, NAD, afebrile, non tachycardic, nonseptic and nontoxic appearing.  Does not meet SIRS or sepsis criteria.  Clinically suggestive of early cellulitis with likely contact dermatitis, considering the pruritus and onset 3 days after tattoo.  My suspicion is that the dermatitis may be due to the colored ink, as he has not had any issues with black ink in the past and this was his first time receiving colored ink.  Low suspicion for DVT, Wells criteria of -2, upper extremities appear neurovascularly intact.  Compartments are soft, low suspicion for compartment syndrome.  Low suspicion for SJS, TENS, or other serious skin condition.  Low suspicion for necrotizing fasciitis or osteomyelitis.  Pt is without risk factors for HIV; no IV drug use in the last 2 years and no visible track marks.  No recent use of steroids or other immunosuppressive medications.  No Hx of diabetes and blood glucose of 96 today.  No heart murmur appreciated.  Pt is without gross abscess for which I&D would be possible; no active drainage or purulent discharge.  A few sparse dried scabs visible.  Epigastric tenderness likely due to several episodes of vomiting.  Low suspicion for acute intra-abdominal pathology.  Provided  fluid bolus with Zofran and Benadryl.  Plan to reassess.    Upon reevaluation, appreciated remarkable improvement.  Pt encouraged to return if redness begins to streak, extends beyond the markings, and/or fever or N/V develop.  Plan to discharge on antibiotics with close PCP follow-up.  Also recommend Benadryl, Zofran, and other conservative measures for additional symptom management.  Patient satisfied with today's visit.  Patient in NAD in good condition at time of discharge.  Disposition: After consideration of the diagnostic results and the patient's encounter today, I feel that the emergency department workup does not suggest an emergent condition requiring admission or immediate intervention beyond what has been performed at this time.  The patient is safe for discharge and has been instructed to return immediately for worsening symptoms, change in symptoms or any other concerns.  I have reviewed the patients home medicines and have made adjustments as needed.  Discussed course of treatment thoroughly with the patient, whom demonstrated understanding.  Patient in agreement and has no further questions.    I discussed this case with my attending physician Dr. Estell Harpin, who agreed with the proposed treatment course and cosigned this note including patient's presenting symptoms, physical exam, and planned diagnostics and interventions.  Attending physician stated agreement with plan or made changes to plan which were implemented.     This chart was dictated using voice recognition software.  Despite best efforts to proofread, errors can occur which can change the documentation meaning.         Final Clinical Impression(s) / ED Diagnoses Final diagnoses:  Cellulitis of right upper arm  Contact dermatitis due to other agent, unspecified contact dermatitis type    Rx / DC Orders ED Discharge Orders          Ordered    ondansetron (ZOFRAN-ODT) 4 MG disintegrating tablet  Every 8 hours PRN         11/21/21 1631    cephALEXin (KEFLEX) 500 MG capsule  4 times daily        11/21/21 1631    diphenhydrAMINE (BENADRYL) 25 MG tablet  Every 6 hours PRN        11/21/21 1631  Cecil Cobbs, PA-C 11/22/21 1904    Bethann Berkshire, MD 11/24/21 [phone]

## 2023-02-05 ENCOUNTER — Other Ambulatory Visit (HOSPITAL_BASED_OUTPATIENT_CLINIC_OR_DEPARTMENT_OTHER): Payer: Self-pay

## 2023-02-05 DIAGNOSIS — R0683 Snoring: Secondary | ICD-10-CM

## 2023-02-06 ENCOUNTER — Encounter: Payer: Self-pay | Admitting: Pulmonary Disease

## 2023-02-18 ENCOUNTER — Ambulatory Visit: Payer: BC Managed Care – PPO | Attending: Nurse Practitioner | Admitting: Pulmonary Disease

## 2023-02-18 DIAGNOSIS — R0683 Snoring: Secondary | ICD-10-CM | POA: Insufficient documentation

## 2023-02-18 DIAGNOSIS — G4733 Obstructive sleep apnea (adult) (pediatric): Secondary | ICD-10-CM | POA: Insufficient documentation

## 2023-02-21 DIAGNOSIS — R0683 Snoring: Secondary | ICD-10-CM

## 2023-02-21 NOTE — Procedures (Signed)
Patient Name: Ricardo Knox, Ricardo Knox Date: 02/18/2023 Gender: Male D.O.B: [birth year] Age (years): 37 Referring Provider: Cheron Every FNP Height (inches): 71 Interpreting Physician: Cyril Mourning MD, ABSM Weight (lbs): 180 RPSGT: Darald, Braccia BMI: 25 MRN: [id number] Neck Size: <br> <br> <br> CLINICAL INFORMATION Sleep Study Type: HST    Indication for sleep study: snoring, non refreshing sleep, fatigue    Epworth Sleepiness Score: N/A  SLEEP STUDY TECHNIQUE A multi-channel overnight portable sleep study was performed. The channels recorded were: nasal airflow, thoracic respiratory movement, and oxygen saturation with a pulse oximetry. Snoring was also monitored.  MEDICATIONS Patient self administered medications include: N/A.  SLEEP ARCHITECTURE Patient was studied for 523.3 minutes. The sleep efficiency was 99.2 % and the patient was supine for 0%. The arousal index was 0.0 per hour.  RESPIRATORY PARAMETERS The overall AHI was 7.3 per hour, with a central apnea index of 0 per hour.  The oxygen nadir was 92% during sleep.  CARDIAC DATA Mean heart rate during sleep was 57.3 bpm.  IMPRESSIONS - Mild obstructive sleep apnea occurred during this study (AHI = 7.3/h). - The patient had minimal or no oxygen desaturation during the study (Min O2 = 92%) - Patient snored 3.9% during the sleep.   DIAGNOSIS - Obstructive Sleep Apnea (G47.33)   RECOMMENDATIONS - Treatment options for this degree of sleep disordered breathing include no treatment, dental appliance or CPAP therapy - Avoid alcohol, sedatives and other CNS depressants that may worsen sleep apnea and disrupt normal sleep architecture. - Sleep hygiene should be reviewed to assess factors that may improve sleep quality. - Weight management and regular exercise should be initiated or continued.  [Electronically signed] 02/21/2023 06:22 PM  Cyril Mourning MD, ABSM Diplomate, American Board of Sleep  Medicine NPI: [id number]

## 2023-03-01 ENCOUNTER — Telehealth: Payer: Self-pay | Admitting: Obstetrics & Gynecology

## 2023-03-01 NOTE — Telephone Encounter (Signed)
Vitamin C 1000mg  Vitamin E 400units Folic acid Zinc 50mg   Called pt regarding semen analysis results.  Pt notes h/o MJ and tobacco use.  Strongly encouraged pt to stop MJ use.  Advised starting about vitamins.  Plan for referral to REI  Myna Hidalgo, DO Attending Obstetrician & Gynecologist, [facility] for [facility], [facility]

## 2023-04-03 ENCOUNTER — Emergency Department (HOSPITAL_COMMUNITY)
Admission: EM | Admit: 2023-04-03 | Discharge: 2023-04-03 | Disposition: A | Discharge status: Home / Self Care | Payer: BC Managed Care – PPO | Attending: Emergency Medicine | Admitting: Emergency Medicine

## 2023-04-03 ENCOUNTER — Encounter (HOSPITAL_COMMUNITY): Payer: Self-pay | Admitting: Radiology

## 2023-04-03 ENCOUNTER — Other Ambulatory Visit: Payer: Self-pay

## 2023-04-03 DIAGNOSIS — H81391 Other peripheral vertigo, right ear: Secondary | ICD-10-CM | POA: Insufficient documentation

## 2023-04-03 DIAGNOSIS — H81399 Other peripheral vertigo, unspecified ear: Secondary | ICD-10-CM

## 2023-04-03 DIAGNOSIS — H6991 Unspecified Eustachian tube disorder, right ear: Secondary | ICD-10-CM | POA: Diagnosis not present

## 2023-04-03 DIAGNOSIS — R42 Dizziness and giddiness: Secondary | ICD-10-CM | POA: Diagnosis present

## 2023-04-03 LAB — BASIC METABOLIC PANEL
Anion gap: 10 (ref 5–15)
BUN: 9 mg/dL (ref 6–20)
CO2: 25 mmol/L (ref 22–32)
Calcium: 9.2 mg/dL (ref 8.9–10.3)
Chloride: 102 mmol/L (ref 98–111)
Creatinine, Ser: 0.67 mg/dL (ref 0.61–1.24)
GFR, Estimated: >60 mL/min (ref >60)
Glucose, Bld: 110 mg/dL — ABNORMAL HIGH (ref 70–99)
Potassium: 4.4 mmol/L (ref 3.5–5.1)
Sodium: 137 mmol/L (ref 135–145)

## 2023-04-03 LAB — CBC WITH DIFFERENTIAL/PLATELET
Abs Immature Granulocytes: 0.03 10*3/uL (ref 0.00–0.07)
Basophils Absolute: 0.1 10*3/uL (ref 0.0–0.1)
Basophils Relative: 1 %
Eosinophils Absolute: 0.1 10*3/uL (ref 0.0–0.5)
Eosinophils Relative: 2 %
HCT: 45.9 % (ref 39.0–52.0)
Hemoglobin: 15 g/dL (ref 13.0–17.0)
Immature Granulocytes: 0 %
Lymphocytes Relative: 34 %
Lymphs Abs: 2.7 10*3/uL (ref 0.7–4.0)
MCH: 27.6 pg (ref 26.0–34.0)
MCHC: 32.7 g/dL (ref 30.0–36.0)
MCV: 84.4 fL (ref 80.0–100.0)
Monocytes Absolute: 0.4 10*3/uL (ref 0.1–1.0)
Monocytes Relative: 5 %
Neutro Abs: 4.7 10*3/uL (ref 1.7–7.7)
Neutrophils Relative %: 58 %
Platelets: 252 10*3/uL (ref 150–400)
RBC: 5.44 MIL/uL (ref 4.22–5.81)
RDW: 12.5 % (ref 11.5–15.5)
WBC: 8.1 10*3/uL (ref 4.0–10.5)
nRBC: 0 % (ref 0.0–0.2)

## 2023-04-03 MED ORDER — ONDANSETRON 4 MG PO TBDP: 4.0000 mg | ORAL_TABLET | Freq: Four times a day (QID) | ORAL | 0 refills | Status: AC | PRN

## 2023-04-03 MED ORDER — MECLIZINE HCL 25 MG PO TABS: 25.0000 mg | ORAL_TABLET | Freq: Three times a day (TID) | ORAL | 0 refills | Status: AC | PRN

## 2023-04-03 MED ORDER — LORAZEPAM 1 MG PO TABS
1.0000 mg | ORAL_TABLET | Freq: Once | ORAL | Status: AC
  Administered 2023-04-03: 1 mg via ORAL
  Filled 2023-04-03: qty 1

## 2023-04-03 MED ORDER — FLUTICASONE PROPIONATE 50 MCG/ACT NA SUSP: 2.0000 | Freq: Every day | NASAL | 2 refills | Status: AC

## 2023-04-03 MED ORDER — MECLIZINE HCL 12.5 MG PO TABS
25.0000 mg | ORAL_TABLET | Freq: Once | ORAL | Status: AC
  Administered 2023-04-03: 25 mg via ORAL
  Filled 2023-04-03: qty 2

## 2023-04-03 MED ORDER — ONDANSETRON 4 MG PO TBDP
4.0000 mg | ORAL_TABLET | Freq: Once | ORAL | Status: AC
  Administered 2023-04-03: 4 mg via ORAL
  Filled 2023-04-03: qty 1

## 2023-04-03 NOTE — Discharge Instructions (Addendum)
It was a pleasure taking care of you today.  You are seen for dizziness.  Your symptoms are consistent with vertigo.  Your exam was overall very reassuring, this is likely peripheral vertigo.  We will  treat you  with meclizine, which can help with your symptoms.  Make sure to drink plenty of fluids and rest.  Also use the Flonase.  You have some fluid in your right ear and this can help.  Follow-up close with your primary care doctor.  Do not drive when you are feeling dizzy, additionally the meclizine can make you sleepy.  Come back to the ER if you have any numbness or weakness, severe headache, loss of balance, persistent vomiting or any other new or worsening symptoms.  Follow-up with your primary care doctor.

## 2023-04-03 NOTE — ED Triage Notes (Signed)
Pt states woke this morning around 6am dizzy. Pt endorses having a cold last week. Pt states when he sits down the dizziness calms down but when he stands back up it gets bad again. Pt states he knows he is supposed to use a cpap machine but has not started it yet.

## 2023-04-03 NOTE — ED Provider Notes (Signed)
Ricardo Knox EMERGENCY DEPARTMENT AT [facility] Provider Note   CSN: [id number] Arrival date & time: 04/03/23  1011     History  Chief Complaint  Patient presents with   Dizziness    Ricardo Knox is a 37 y.o. male.  He has PMH of chronic back pain but is otherwise relatively healthy.  Presents to ER today complaining of dizziness which had been as spinning sensation that started suddenly when he woke up this morning, he states he stood up and started feeling spinning like he was going to pass out and also got very nauseous but did not vomit.  He notes he had a cold about a week ago but denies any current cough, fever congestion.  No chest pain or shortness of breath.  He has never had this in the past.  Denies tinnitus.  Denies any extremity numbness tingling or weakness.  Denies any headache or head injuries.   Dizziness      Home Medications Prior to Admission medications   Medication Sig Start Date End Date Taking? Authorizing Provider  fluticasone (FLONASE) 50 MCG/ACT nasal spray Place 2 sprays into both nostrils daily for 7 days. 04/03/23 04/10/23 Yes Rilynn Habel A, PA-C  meclizine (ANTIVERT) 25 MG tablet Take 1 tablet (25 mg total) by mouth 3 (three) times daily as needed for dizziness. 04/03/23  Yes Brianni Manthe A, PA-C  ondansetron (ZOFRAN-ODT) 4 MG disintegrating tablet Take 1 tablet (4 mg total) by mouth every 6 (six) hours as needed for nausea or vomiting. 04/03/23  Yes Terris Germano A, PA-C  buPROPion (WELLBUTRIN XL) 150 MG 24 hr tablet every morning. 08/12/17   [provider]  diphenhydrAMINE (BENADRYL) 25 MG tablet Take 1 tablet (25 mg total) by mouth every 6 (six) hours as needed for itching. 11/21/21   Cecil Cobbs, PA-C  methimazole (TAPAZOLE) 5 MG tablet TAKE (1/2) TABLET BY MOUTH ONCE DAILY. 06/16/18   Roma Kayser, MD  ondansetron (ZOFRAN-ODT) 4 MG disintegrating tablet Take 1 tablet (4 mg total) by mouth every 8 (eight)  hours as needed for nausea or vomiting. 11/21/21   Cecil Cobbs, PA-C  tizanidine (ZANAFLEX) 2 MG capsule Take by mouth once.    [provider]  venlafaxine XR (EFFEXOR-XR) 75 MG 24 hr capsule every morning. 08/12/17   [provider]      Allergies    Hydrocodone    Review of Systems   Review of Systems  Neurological:  Positive for dizziness.    Physical Exam Updated Vital Signs BP (!) 130/100 (BP Location: Left Arm)   Pulse 68   Temp 97.9 F (36.6 C) (Oral)   Resp 17   Ht 6' (1.829 m)   Wt 77.1 kg   SpO2 99%   BMI 23.06 kg/m  Physical Exam Vitals and nursing note reviewed.  Constitutional:      General: He is not in acute distress.    Appearance: He is well-developed.  HENT:     Head: Normocephalic and atraumatic.     Right Ear: Ear canal normal. A middle ear effusion is present. Tympanic membrane is not perforated or erythematous.     Left Ear: Tympanic membrane and ear canal normal.     Mouth/Throat:     Lips: Pink.     Pharynx: Oropharynx is clear. Uvula midline.  Eyes:     General: No visual field deficit.    Extraocular Movements: Extraocular movements intact.     Right eye:  Normal extraocular motion and no nystagmus.     Left eye: Normal extraocular motion and no nystagmus.     Conjunctiva/sclera: Conjunctivae normal.     Pupils: Pupils are equal, round, and reactive to light.  Cardiovascular:     Rate and Rhythm: Normal rate and regular rhythm.     Heart sounds: No murmur heard. Pulmonary:     Effort: Pulmonary effort is normal. No respiratory distress.     Breath sounds: Normal breath sounds.  Abdominal:     Palpations: Abdomen is soft.     Tenderness: There is no abdominal tenderness.  Musculoskeletal:        General: No swelling. Normal range of motion.     Cervical back: Neck supple.  Skin:    General: Skin is warm and dry.     Capillary Refill: Capillary refill takes less than 2 seconds.  Neurological:     General: No  focal deficit present.     Mental Status: He is alert and oriented to person, place, and time.     GCS: GCS eye subscore is 4. GCS verbal subscore is 5. GCS motor subscore is 6.     Cranial Nerves: No cranial nerve deficit, dysarthria or facial asymmetry.     Sensory: No sensory deficit.     Motor: No weakness.     Coordination: Coordination is intact. Coordination normal.     Gait: Gait is intact. Gait normal.     Comments: Normal finger-nose, normal heel-to-shin, strength and sensation intact in bilateral upper and lower extremities, patient can ambulate with a steady gait, negative Romberg and normal heel-to-toe walk.  Psychiatric:        Mood and Affect: Mood normal.     ED Results / Procedures / Treatments   Labs (all labs ordered are listed, but only abnormal results are displayed) Labs Reviewed  BASIC METABOLIC PANEL - Abnormal; Notable for the following components:      Result Value   Glucose, Bld 110 (*)    All other components within normal limits  CBC WITH DIFFERENTIAL/PLATELET    EKG None  Radiology No results found.  Procedures Procedures    Medications Ordered in ED Medications  ondansetron (ZOFRAN-ODT) disintegrating tablet 4 mg (4 mg Oral Given 04/03/23 1228)  meclizine (ANTIVERT) tablet 25 mg (25 mg Oral Given 04/03/23 1228)  LORazepam (ATIVAN) tablet 1 mg (1 mg Oral Given 04/03/23 1228)    ED Course/ Medical Decision Making/ A&P                                 Medical Decision Making This patient presents to the ED for concern of dizziness described as spinning that started this morning, this involves an extensive number of treatment options, and is a complaint that carries with it a high risk of complications and morbidity.  The differential diagnosis includes BPPV, labyrinthitis, Mnire's disease, CVA, other     Additional history obtained:  Additional history obtained from EMR External records from outside source obtained and reviewed  including notes from prior outpatient visits, and prior ED notes   Lab Tests:  I Ordered, and personally interpreted labs.  The pertinent results include:  CBC and BMP normal     Problem List / ED Course / Critical interventions / Medication management  -Patient presents with symptoms of dizziness complaining of a spinning sensation that started this morning, had recent URI symptoms and does  have right middle ear effusion.  He has a normal neurologic exam, no ataxia.  Having nausea with this but no vomiting.  He does not have any nystagmus, he is feeling much better after Ativan and meclizine.  Discussed with patient likely peripheral vertigo, possibly related to his right middle ear effusion.  Will treat with Flonase, advised on PCP follow-up and strict return precautions.  Advised not to drive while feeling dizzy or taking medications.  I have a very low suspicion for central cause of his vertigo given young age, lack of risk factors and normal exam. I have reviewed the patients home medicines and have made adjustments as needed    Amount and/or Complexity of Data Reviewed Labs: ordered.  Risk Prescription drug management.           Final Clinical Impression(s) / ED Diagnoses Final diagnoses:  Peripheral vertigo, unspecified laterality  Eustachian tube dysfunction, right    Rx / DC Orders ED Discharge Orders          Ordered    fluticasone (FLONASE) 50 MCG/ACT nasal spray  Daily        04/03/23 1332    meclizine (ANTIVERT) 25 MG tablet  3 times daily PRN        04/03/23 1334    ondansetron (ZOFRAN-ODT) 4 MG disintegrating tablet  Every 6 hours PRN        04/03/23 1336              Ma Rings, PA-C 04/03/23 1346    Eber Hong, MD 04/03/23 2021

## 2023-06-25 LAB — LAB REPORT - SCANNED: TSH: 1.54 (ref 0.41–5.90)

## 2023-11-25 ENCOUNTER — Other Ambulatory Visit (HOSPITAL_COMMUNITY): Payer: Self-pay | Admitting: Nurse Practitioner

## 2023-11-25 DIAGNOSIS — N50812 Left testicular pain: Secondary | ICD-10-CM

## 2023-12-04 ENCOUNTER — Ambulatory Visit (HOSPITAL_COMMUNITY): Attending: Nurse Practitioner

## 2023-12-04 ENCOUNTER — Encounter (HOSPITAL_COMMUNITY): Payer: Self-pay
